# Patient Record
Sex: Female | Born: 1955 | Race: White | Hispanic: No | Marital: Single | Smoking: Never smoker
Health system: Southern US, Community
[De-identification: ages and names within clinical notes are randomized; demographics above are authoritative.]

## PROBLEM LIST (undated history)

## (undated) DIAGNOSIS — E785 Hyperlipidemia, unspecified: Secondary | ICD-10-CM

## (undated) DIAGNOSIS — R112 Nausea with vomiting, unspecified: Secondary | ICD-10-CM

## (undated) DIAGNOSIS — D649 Anemia, unspecified: Secondary | ICD-10-CM

## (undated) DIAGNOSIS — F32A Depression, unspecified: Secondary | ICD-10-CM

## (undated) DIAGNOSIS — E039 Hypothyroidism, unspecified: Secondary | ICD-10-CM

## (undated) DIAGNOSIS — Z789 Other specified health status: Secondary | ICD-10-CM

## (undated) DIAGNOSIS — M1990 Unspecified osteoarthritis, unspecified site: Secondary | ICD-10-CM

## (undated) DIAGNOSIS — I10 Essential (primary) hypertension: Secondary | ICD-10-CM

## (undated) DIAGNOSIS — C541 Malignant neoplasm of endometrium: Secondary | ICD-10-CM

## (undated) DIAGNOSIS — T8859XA Other complications of anesthesia, initial encounter: Secondary | ICD-10-CM

## (undated) DIAGNOSIS — Z9889 Other specified postprocedural states: Secondary | ICD-10-CM

## (undated) DIAGNOSIS — F988 Other specified behavioral and emotional disorders with onset usually occurring in childhood and adolescence: Secondary | ICD-10-CM

## (undated) DIAGNOSIS — T4145XA Adverse effect of unspecified anesthetic, initial encounter: Secondary | ICD-10-CM

## (undated) DIAGNOSIS — F329 Major depressive disorder, single episode, unspecified: Secondary | ICD-10-CM

## (undated) HISTORY — DX: Essential (primary) hypertension: I10

## (undated) HISTORY — PX: NASAL SEPTUM SURGERY: SHX37

## (undated) HISTORY — DX: Hyperlipidemia, unspecified: E78.5

## (undated) HISTORY — PX: CHOLECYSTECTOMY: SHX55

## (undated) HISTORY — PX: TUBAL LIGATION: SHX77

## (undated) HISTORY — PX: ABDOMINAL HYSTERECTOMY: SHX81

## (undated) HISTORY — PX: DILATION AND CURETTAGE OF UTERUS: SHX78

## (undated) HISTORY — DX: Other specified health status: Z78.9

## (undated) HISTORY — DX: Malignant neoplasm of endometrium: C54.1

## (undated) HISTORY — DX: Unspecified osteoarthritis, unspecified site: M19.90

---

## 2005-05-13 ENCOUNTER — Emergency Department (HOSPITAL_COMMUNITY): Admission: EM | Admit: 2005-05-13 | Discharge: 2005-05-13 | Payer: Self-pay | Admitting: Family Medicine

## 2005-05-19 ENCOUNTER — Emergency Department (HOSPITAL_COMMUNITY): Admission: EM | Admit: 2005-05-19 | Discharge: 2005-05-19 | Payer: Self-pay | Admitting: Family Medicine

## 2005-06-06 ENCOUNTER — Emergency Department (HOSPITAL_COMMUNITY): Admission: EM | Admit: 2005-06-06 | Discharge: 2005-06-06 | Payer: Self-pay | Admitting: Emergency Medicine

## 2005-09-22 ENCOUNTER — Emergency Department (HOSPITAL_COMMUNITY): Admission: EM | Admit: 2005-09-22 | Discharge: 2005-09-22 | Payer: Self-pay | Admitting: Emergency Medicine

## 2005-09-23 ENCOUNTER — Inpatient Hospital Stay (HOSPITAL_COMMUNITY): Admission: AD | Admit: 2005-09-23 | Discharge: 2005-09-23 | Payer: Self-pay | Admitting: Obstetrics and Gynecology

## 2005-09-24 ENCOUNTER — Ambulatory Visit: Payer: Self-pay | Admitting: Obstetrics and Gynecology

## 2005-09-25 ENCOUNTER — Ambulatory Visit (HOSPITAL_COMMUNITY): Admission: RE | Admit: 2005-09-25 | Discharge: 2005-09-25 | Payer: Self-pay | Admitting: Obstetrics and Gynecology

## 2005-09-25 ENCOUNTER — Ambulatory Visit: Payer: Self-pay | Admitting: Obstetrics and Gynecology

## 2005-09-25 ENCOUNTER — Encounter (INDEPENDENT_AMBULATORY_CARE_PROVIDER_SITE_OTHER): Payer: Self-pay | Admitting: Specialist

## 2005-10-09 ENCOUNTER — Ambulatory Visit: Payer: Self-pay | Admitting: Obstetrics and Gynecology

## 2007-08-06 HISTORY — PX: CHOLECYSTECTOMY, LAPAROSCOPIC: SHX56

## 2008-03-14 ENCOUNTER — Emergency Department (HOSPITAL_COMMUNITY): Admission: EM | Admit: 2008-03-14 | Discharge: 2008-03-14 | Payer: Self-pay | Admitting: Emergency Medicine

## 2008-10-19 ENCOUNTER — Ambulatory Visit: Payer: Self-pay | Admitting: Diagnostic Radiology

## 2008-10-19 ENCOUNTER — Emergency Department (HOSPITAL_BASED_OUTPATIENT_CLINIC_OR_DEPARTMENT_OTHER): Admission: EM | Admit: 2008-10-19 | Discharge: 2008-10-19 | Payer: Self-pay | Admitting: Emergency Medicine

## 2008-11-22 ENCOUNTER — Emergency Department (HOSPITAL_COMMUNITY): Admission: EM | Admit: 2008-11-22 | Discharge: 2008-11-22 | Payer: Self-pay | Admitting: Pediatrics

## 2009-08-23 IMAGING — CR DG HIP COMPLETE 2+V*R*
3 series · 3 of 3 positions shown · non-contrast
Comparison: None available

CLINICAL DATA: Fall.  Back pain.

RIGHT HIP - COMPLETE 2+ VIEW

[t pelvis a.p.]
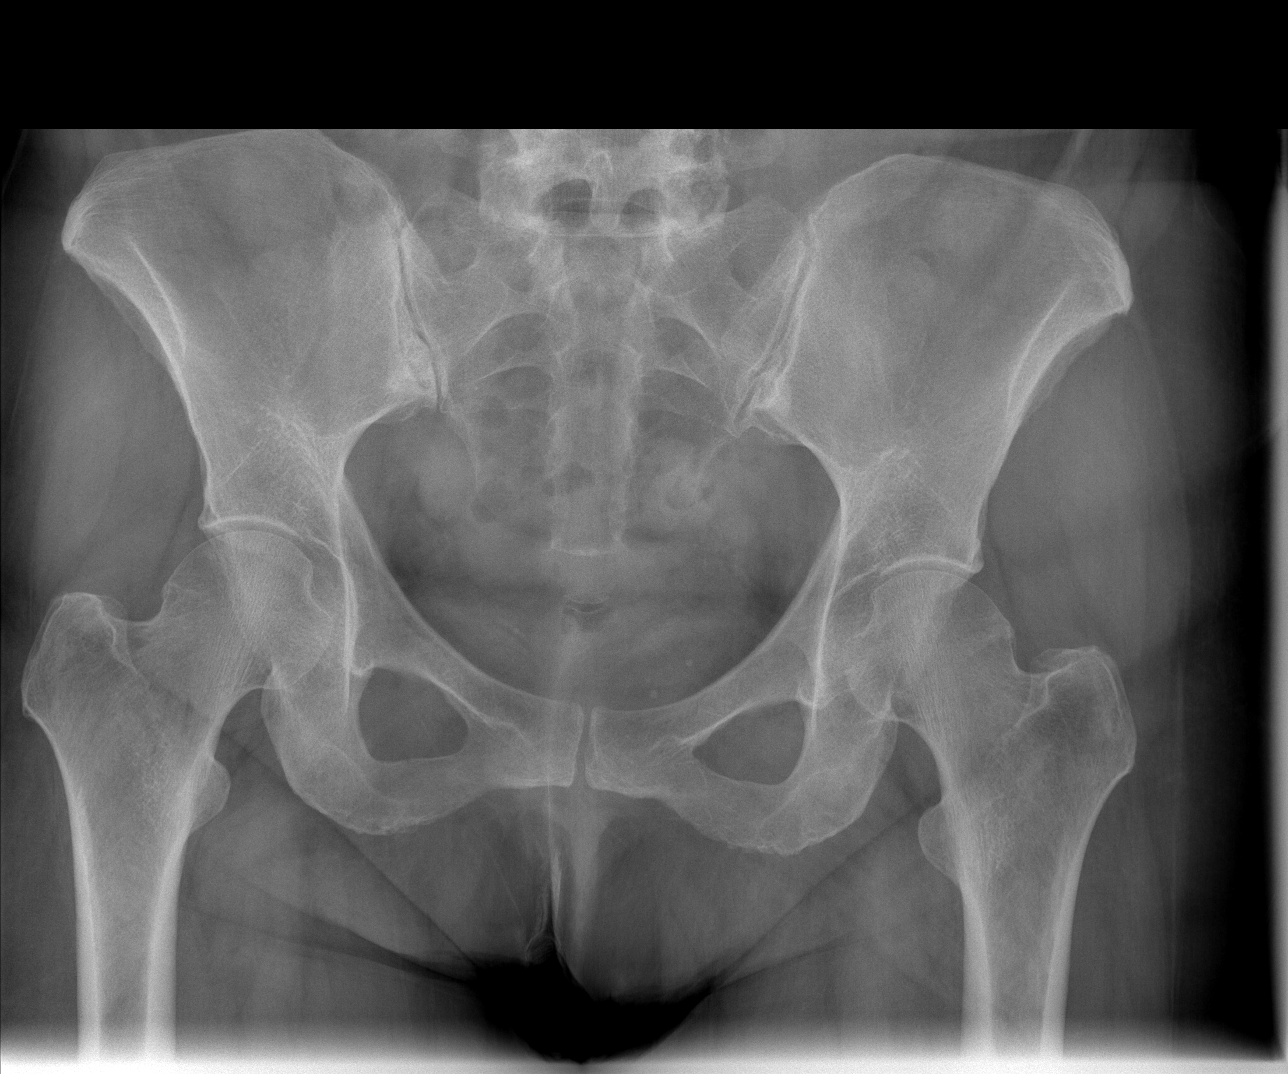

[t hip ap right]
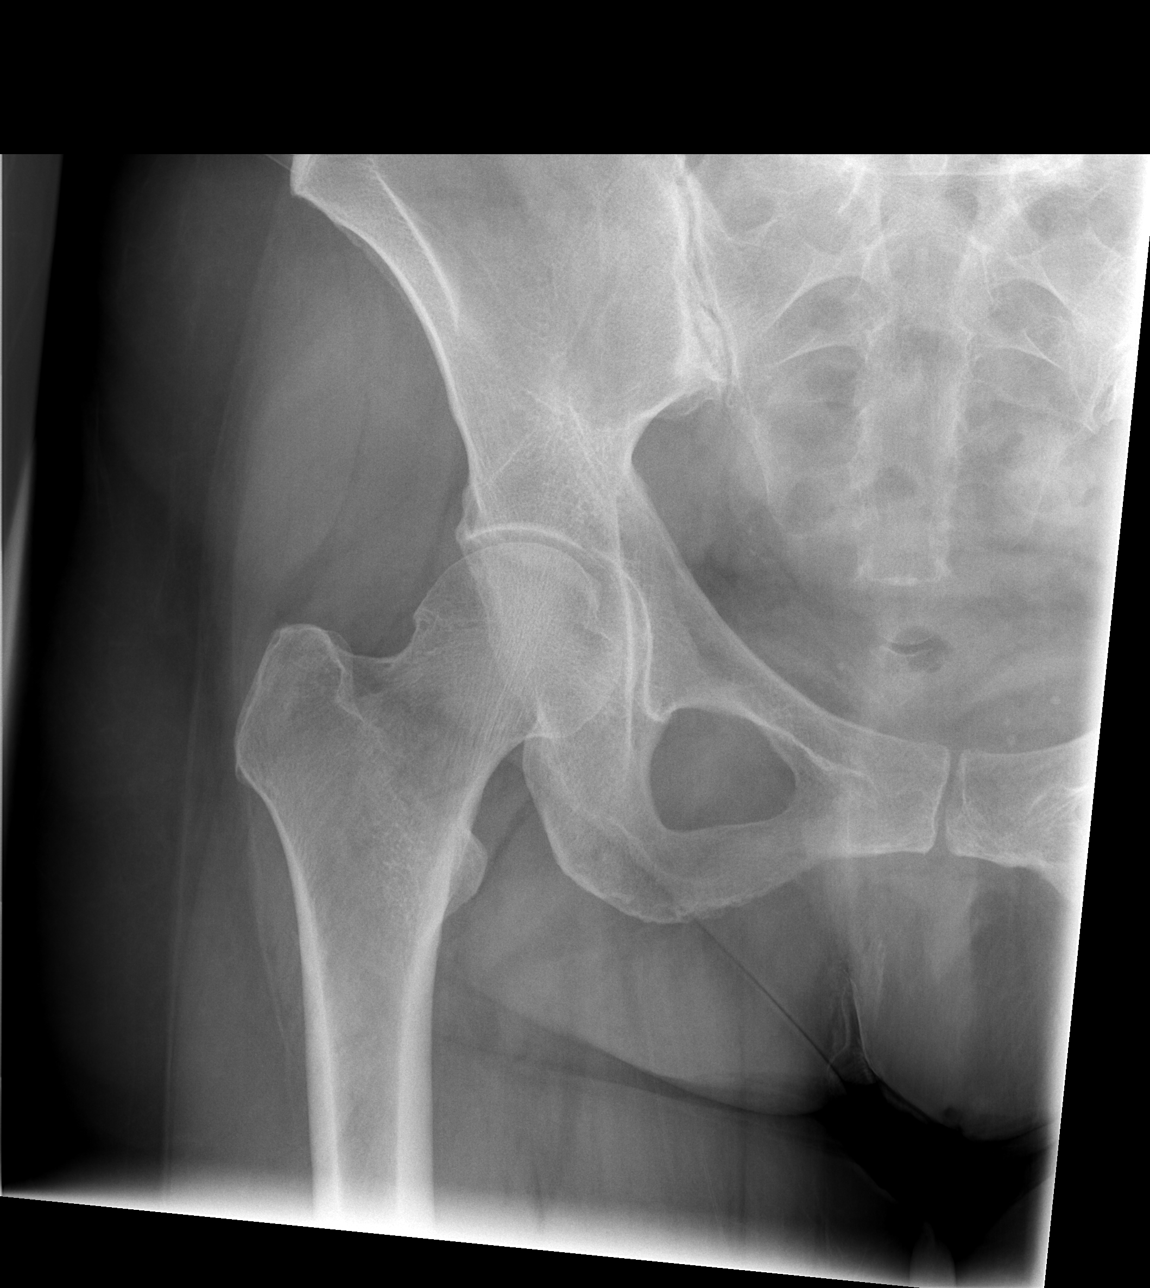

[t hip frog leg right]
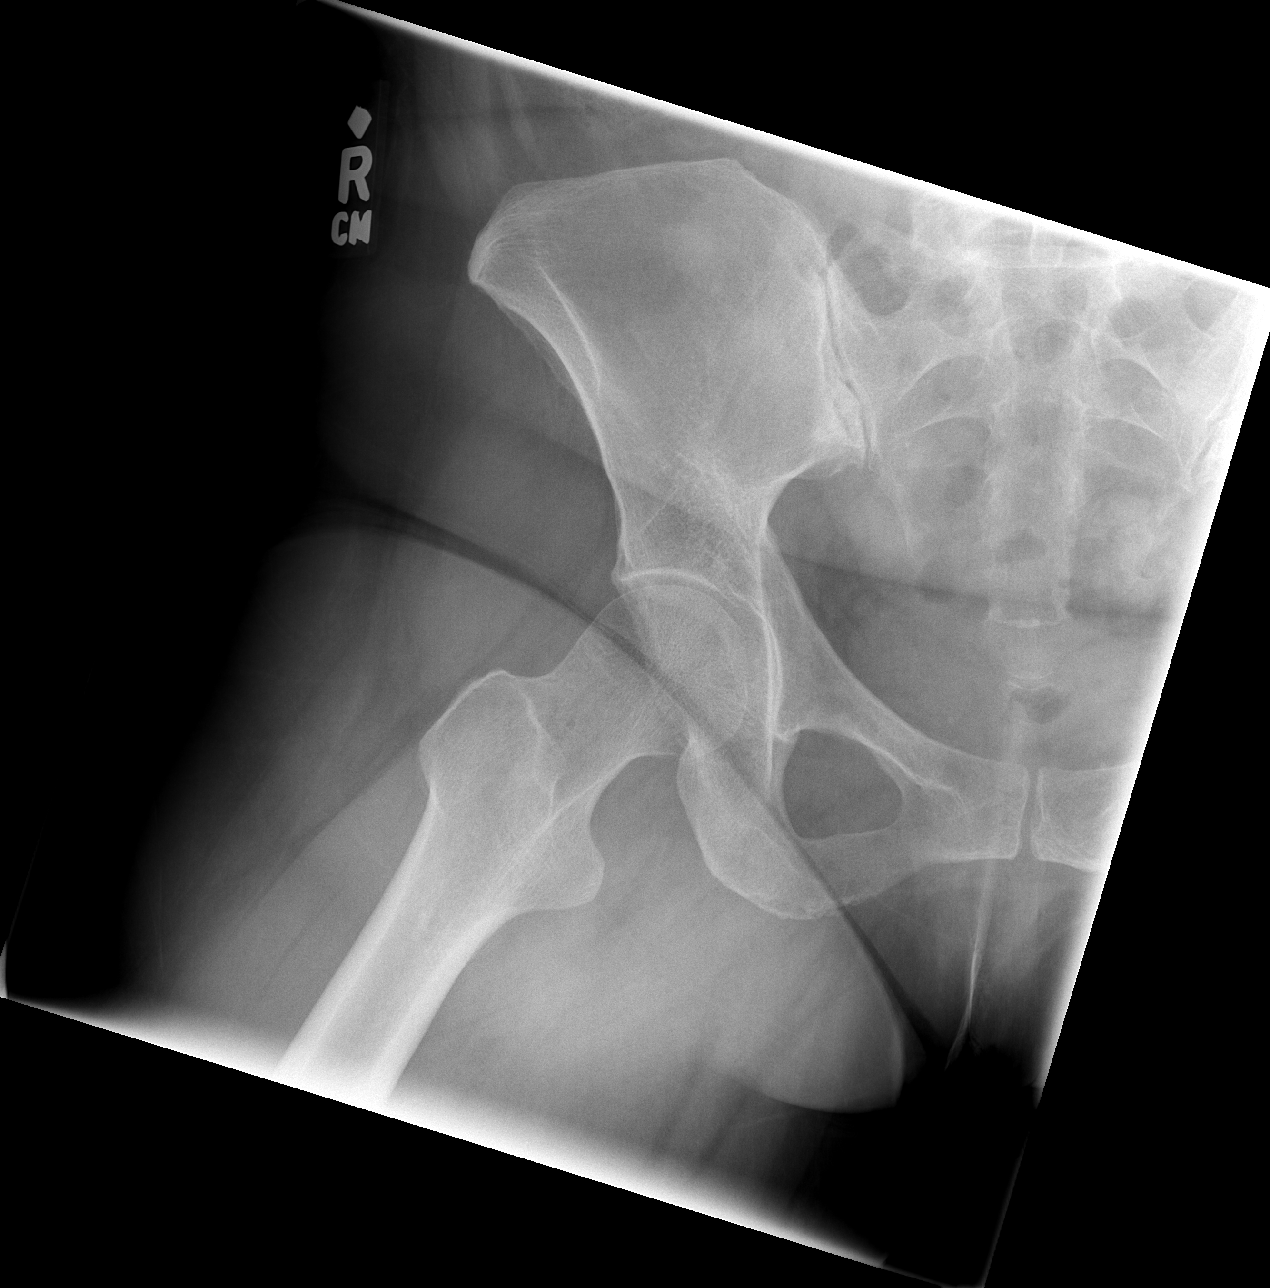

[3 of 3 positions shown; findings below may reference images not displayed]

FINDINGS: Pelvic rings appear intact.  Hip joint spaces are
symmetric bilaterally.  No fracture is identified.  Calcified
phleboliths are present in the anatomic pelvis.
IMPRESSION: No acute osseous abnormality.

## 2010-12-21 NOTE — Group Therapy Note (Signed)
[name], Emily [name]                ACCOUNT NO.:  [id number]   MEDICAL RECORD NO.:  [id number]          PATIENT TYPE:  WOC   LOCATION:  [facility]                   FACILITY:  [facility]   PHYSICIAN:  Argentina Donovan, MD        DATE OF BIRTH:  Jul 09, 1956   DATE OF SERVICE:  09/24/2005                                    CLINIC NOTE   The patient is a 55 year old white female, gravida 1, para 1-0-0-1, who has  severe perimenopausal bleeding, has been bleeding for about 3 weeks and had  a hemoglobin in the emergency room of 11.3 and was told that she probably  had a pelvic mass.   EXAMINATION:  ABDOMEN:  Soft, flat, nontender, no masses and no  organomegaly.  PELVIC EXAMINATION:  External genitalia is normal with the exception of  bleeding from the vault.  BUS within normal limits.  The vagina is clean  with heavy bleeding within the vault and clots within the cervix.  The  cervix is fish-mouthed and hypertrophied, and the uterus palpates about the  normal size and shape.   It was decided we will do a D&C instead of endometrial biopsy because of the  amount of bleeding.  I have discussed hormone therapy with the patient, who  is a non-smoker.  The only medication she is on is Effexor 300 q.h.s. and  Klonopin 1 mg t.i.d.   IMPRESSION:  Perimenopausal bleeding, to rule out endometrial hyperplasia.   PLAN:  D&C.           ______________________________  Argentina Donovan, MD     PR/MEDQ  D:  09/24/2005  T:  09/24/2005  Job:  [id number]

## 2010-12-21 NOTE — Op Note (Signed)
Emily [name], Emily [name]                ACCOUNT NO.:  [id number]   MEDICAL RECORD NO.:  [id number]          PATIENT TYPE:  AMB   LOCATION:  [facility]                           [facility]   PHYSICIAN:  Phil D. Okey Dupre, M.D.     DATE OF BIRTH:  Dec 13, 1955   DATE OF PROCEDURE:  09/25/2005  DATE OF DISCHARGE:                                 OPERATIVE REPORT   PROCEDURE:  Dilatation curettage. Examination under anesthesia.   PREOPERATIVE DIAGNOSIS:  Dysfunctional uterine bleeding, perimenopausal.   POSTOPERATIVE DIAGNOSIS:  Dysfunctional uterine bleeding, perimenopausal.  Pending pathology report.   SURGEON:  Dr. Okey Dupre   SPECIMENS TO PATHOLOGY:  Endometrial curettings.   ANESTHESIA:  MAC plus local.   POSTOPERATIVE CONDITION:  Satisfactory.   REASON FOR PROCEDURE:  The patient has been a 55 year old nonsmoking female  who has been bleeding for more than 3 weeks. Endometrial biopsy in the  office was deferred because of the amount of bleeding and instead she is  being admitted for dilatation curettage and exam under anesthesia.   DESCRIPTION OF PROCEDURE:  Under satisfactory MAC analgesia with the patient  in dorsolithotomy position, the perineum, vagina prepped and draped in usual  sterile manner. Bimanual pelvic examination revealed the uterus of normal  size, shape, consistency with a large fishmouth cervix. A weighted speculum  placed in posterior fourchette of the vagina through a marital introitus.  BUS within normal limits. Vagina is clean and well rugated. The anterior lip  of the fishmouth cervix grasped with a single-tooth tenaculum. Uterine  cavity sounded to 7 cm. The cervical os dilated to #7 Hagar dilator. Uterine  cavity explored with a stone forceps followed by curettage with a small  serrated curette and scant amount of endometrial tissue obtained and sent  for pathological diagnosis. The tenaculum and speculum removed from vagina.  The patient transferred to recovery room in  satisfactory condition. The plan  is hormonal control of this dysfunctional bleeding.           ______________________________  Javier Glazier. Okey Dupre, M.D.     PDR/MEDQ  D:  09/25/2005  T:  09/26/2005  Job:  [id number]
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
## 2010-12-21 NOTE — Group Therapy Note (Signed)
NAMEDEYSY, SCHABEL                ACCOUNT NO.:  [id number]   MEDICAL RECORD NO.:  [id number]          PATIENT TYPE:  WOC   LOCATION:  [facility]                   FACILITY:  [facility]   PHYSICIAN:  Argentina Donovan, MD        DATE OF BIRTH:  08/14/1955   DATE OF SERVICE:                                    CLINIC NOTE   HISTORY OF PRESENT ILLNESS:  The patient is a 55 year old white non-smoking  female who underwent D&C for menometrorrhagia on September 25, 2005, and her  diagnosis returned of benign proliferative endometrium with breakdown  associated with chronic endometritis.  She is still spotting.  We are going  to start her on doxycycline for 10 days and Yaz oral contraceptives for 2  months.  We will see her back at the end of 2 months to evaluate her  condition.           ______________________________  Argentina Donovan, MD     PR/MEDQ  D:  10/09/2005  T:  10/09/2005  Job:  [id number]

## 2011-02-23 ENCOUNTER — Encounter: Payer: Self-pay | Admitting: *Deleted

## 2011-02-23 ENCOUNTER — Emergency Department (HOSPITAL_BASED_OUTPATIENT_CLINIC_OR_DEPARTMENT_OTHER)
Admission: EM | Admit: 2011-02-23 | Discharge: 2011-02-23 | Disposition: A | Discharge status: Home / Self Care | Payer: Self-pay | Attending: Emergency Medicine | Admitting: Emergency Medicine

## 2011-02-23 DIAGNOSIS — J069 Acute upper respiratory infection, unspecified: Secondary | ICD-10-CM | POA: Insufficient documentation

## 2011-02-23 DIAGNOSIS — H6120 Impacted cerumen, unspecified ear: Secondary | ICD-10-CM | POA: Insufficient documentation

## 2011-02-23 DIAGNOSIS — I10 Essential (primary) hypertension: Secondary | ICD-10-CM | POA: Insufficient documentation

## 2011-02-23 HISTORY — DX: Essential (primary) hypertension: I10

## 2011-02-23 MED ORDER — BENZONATATE 100 MG PO CAPS: 200.0000 mg | ORAL_CAPSULE | Freq: Two times a day (BID) | ORAL | Status: AC | PRN

## 2011-02-23 MED ORDER — CETIRIZINE-PSEUDOEPHEDRINE ER 5-120 MG PO TB12: 1.0000 | ORAL_TABLET | Freq: Every day | ORAL | Status: AC

## 2011-02-23 MED ORDER — DOCUSATE SODIUM 100 MG PO CAPS
ORAL_CAPSULE | ORAL | Status: AC
  Administered 2011-02-23: 200 mg
  Filled 2011-02-23: qty 2

## 2011-02-23 NOTE — ED Notes (Signed)
Pt states she has had a cold x 1 week. Ears have been stopped up as well. Works at [employer] and can't hear customers.

## 2011-02-23 NOTE — ED Provider Notes (Signed)
History     Chief Complaint  Patient presents with   Ear Fullness   HPI Comments: Patient has had bilateral ear pain for the last week. States that she has been struggling with a mild cough, sore throat, nasal congestion. The ear fullness has been constant, mild, gradually getting worse, and associated with decreased hearing bilaterally. She has never had these symptoms before but has been using peroxide in her ears and Q-tips to try to clear them out. She denies fevers, nausea, vomiting, abdominal or chest pain.  Patient is a 54 y.o. female presenting with plugged ear sensation. The history is provided by the patient.  Ear Fullness    Past Medical History  Diagnosis Date   Hypertension     Past Surgical History  Procedure Date   Cholecystectomy     History reviewed. No pertinent family history.  History  Substance Use Topics   Smoking status: Never Smoker    Smokeless tobacco: Not on file   Alcohol Use: No    OB History    Grav Para Term Preterm Abortions TAB SAB Ect Mult Living                  Review of Systems  Constitutional: Negative for fever and chills.  HENT: Positive for ear pain, congestion and sore throat.   Eyes: Negative for discharge and redness.  Respiratory: Positive for cough.     Physical Exam  BP 158/100   Pulse 92   Temp(Src) 98.9 F (37.2 C) (Oral)   Resp 20   Ht 5\' 5"  (1.651 m)   Wt 186 lb (84.369 kg)   BMI 30.95 kg/m2   SpO2 98%  Physical Exam  Constitutional: She appears well-developed and well-nourished. No distress.  HENT:  Head: Normocephalic and atraumatic.       Bilateral cerumen impactions. No tenderness to palpation of the tragus or manipulation of the auricle. Membranes not visualized.mucous membranes are moist. Posterior pharynx has mild erythema. No exudate, asymmetry, uvular deviation.  Eyes: Conjunctivae are normal. Right eye exhibits no discharge. Left eye exhibits no discharge. No scleral icterus.  Neck: Normal range  of motion.  Cardiovascular: Normal rate, regular rhythm and normal heart sounds.   Pulmonary/Chest: Effort normal and breath sounds normal. No respiratory distress. She has no wheezes. She has no rales.  Abdominal: Soft. There is no tenderness.  Musculoskeletal: Normal range of motion. She exhibits no edema and no tenderness.  Lymphadenopathy:    She has no cervical adenopathy.  Neurological: She is alert. Coordination normal.  Skin: Skin is warm and dry. No rash noted. She is not diaphoretic.    ED Course  FOREIGN BODY REMOVAL Date/Time: 02/23/2011 10:00 PM Performed by: Eber Hong D Authorized by: Eber Hong D Consent: Verbal consent obtained. Risks and benefits: risks, benefits and alternatives were discussed Consent given by: patient Patient understanding: patient states understanding of the procedure being performed Patient identity confirmed: verbally with patient Body area: ear Location details: left ear Patient sedated: no Localization method: visualized Removal mechanism: curette and irrigation Complexity: simple 1 objects recovered. Objects recovered: Cerumen Post-procedure assessment: residual foreign bodies remain Patient tolerance: Patient tolerated the procedure well with no immediate complications. Comments: Irrigation with Colace and warm saline after removal of the majority of last fall the left. Right cerumen not removed due to pain and  Patient not tolerating procedure on right.    MDM Cerumen removed manually and with irrigation. Will DC home with instructions for proper ear  care including not using Q-tips for further cleaning. We'll also start antihistamine such as Zyrtec for the cough congestion and upper respiratory symptoms. They're clear lungs bilaterally, no fever, normal pulse. I doubt pneumonia.     Vida Roller, MD 02/23/11 2219

## 2011-02-26 ENCOUNTER — Emergency Department (HOSPITAL_BASED_OUTPATIENT_CLINIC_OR_DEPARTMENT_OTHER)
Admission: EM | Admit: 2011-02-26 | Discharge: 2011-02-27 | Disposition: A | Discharge status: Home / Self Care | Payer: Self-pay | Attending: Emergency Medicine | Admitting: Emergency Medicine

## 2011-02-26 ENCOUNTER — Encounter (HOSPITAL_BASED_OUTPATIENT_CLINIC_OR_DEPARTMENT_OTHER): Payer: Self-pay | Admitting: Emergency Medicine

## 2011-02-26 DIAGNOSIS — H60399 Other infective otitis externa, unspecified ear: Secondary | ICD-10-CM | POA: Insufficient documentation

## 2011-02-26 DIAGNOSIS — H9209 Otalgia, unspecified ear: Secondary | ICD-10-CM | POA: Insufficient documentation

## 2011-02-26 NOTE — ED Notes (Signed)
Pt c/o ear fullness with decreased hearing. Pt states she has left ear pain. Pt was seen here 7/21 for same and reports no improvement. Pt has head and sinus congestion

## 2011-02-27 MED ORDER — CIPROFLOXACIN-DEXAMETHASONE 0.3-0.1 % OT SUSP: 3.0000 [drp] | Freq: Two times a day (BID) | OTIC | Status: AC

## 2011-02-27 MED ORDER — CIPROFLOXACIN-DEXAMETHASONE 0.3-0.1 % OT SUSP
4.0000 [drp] | Freq: Once | OTIC | Status: DC
  Filled 2011-02-27: qty 7.5

## 2011-02-27 NOTE — ED Provider Notes (Signed)
History     Chief Complaint  Patient presents with   Ear Fullness   Otalgia   Patient is a 55 y.o. female presenting with plugged ear sensation and ear pain. The history is provided by the patient.  Ear Fullness This is a recurrent problem. The current episode started more than 2 days ago. The problem occurs constantly. The problem has not changed since onset.Pertinent negatives include no chest pain, no abdominal pain, no headaches and no shortness of breath. The symptoms are aggravated by nothing. The symptoms are relieved by nothing.  Otalgia Pertinent negatives include no ear discharge, no headaches, no abdominal pain, no neck pain and no rash.  tretaed here for cerumen impaction both ears and has ongoing fullness L ear and decreased hearing, no F/C, mild ear pain, no HA or rash or neck pain. Some ongoing sore throat and congestion with h/o allergies.   Past Medical History  Diagnosis Date   Hypertension     Past Surgical History  Procedure Date   Cholecystectomy     No family history on file.  History  Substance Use Topics   Smoking status: Never Smoker    Smokeless tobacco: Not on file   Alcohol Use: No    OB History    Grav Para Term Preterm Abortions TAB SAB Ect Mult Living                  Review of Systems  Constitutional: Negative for fever and chills.  HENT: Positive for ear pain and congestion. Negative for nosebleeds, neck pain, neck stiffness, tinnitus and ear discharge.   Eyes: Negative for pain.  Respiratory: Negative for shortness of breath.   Cardiovascular: Negative for chest pain.  Gastrointestinal: Negative for abdominal pain.  Genitourinary: Negative for dysuria.  Musculoskeletal: Negative for back pain.  Skin: Negative for rash.  Neurological: Negative for headaches.  All other systems reviewed and are negative.    Physical Exam  BP 136/79   Pulse 82   Temp(Src) 98.2 F (36.8 C) (Oral)   Resp 17   Ht 5\' 5"  (1.651 m)   Wt 186 lb  (84.369 kg)   BMI 30.95 kg/m2   SpO2 98%  Physical Exam  Constitutional: She is oriented to person, place, and time. She appears well-developed and well-nourished.  HENT:  Head: Normocephalic and atraumatic.  Left Ear: No swelling or tenderness. Tympanic membrane is erythematous. Decreased hearing is noted.       R TM not visulaized 2/2 cerumen. L TM not visulaized 2/2 debris in canal, there is mild abrasion c/w h/o cerumen removal  Eyes: Conjunctivae and EOM are normal. Pupils are equal, round, and reactive to light.  Neck: Trachea normal. Neck supple. No thyromegaly present.  Cardiovascular: Normal rate, regular rhythm, S1 normal, S2 normal and normal pulses.     No systolic murmur is present   No diastolic murmur is present  Pulses:      Radial pulses are 2+ on the right side, and 2+ on the left side.  Pulmonary/Chest: Effort normal and breath sounds normal. She has no wheezes. She has no rhonchi. She has no rales. She exhibits no tenderness.  Abdominal: Soft. Normal appearance and bowel sounds are normal. There is no tenderness. There is no CVA tenderness and negative Murphy's sign.  Musculoskeletal:       BLE:s Calves nontender, no cords or erythema, negative Homans sign  Neurological: She is alert and oriented to person, place, and time. She has normal  strength. No cranial nerve deficit or sensory deficit. GCS eye subscore is 4. GCS verbal subscore is 5. GCS motor subscore is 6.  Skin: Skin is warm and dry. No rash noted. She is not diaphoretic.  Psychiatric: Her speech is normal.       Cooperative and appropriate    ED Course  Procedures  MDM L TM debris and pain will treat for OE and provide referral to ENT.  No symptoms of cerumen impaction R TM. No systemic symptoms of infection, no mastoid tenderness or HAs.       Emily Nielsen, MD 02/27/11 [phone]

## 2012-11-25 ENCOUNTER — Ambulatory Visit: Payer: Self-pay | Admitting: Podiatry

## 2012-11-27 ENCOUNTER — Ambulatory Visit (INDEPENDENT_AMBULATORY_CARE_PROVIDER_SITE_OTHER): Payer: BC Managed Care – PPO | Admitting: Podiatry

## 2012-11-27 ENCOUNTER — Encounter (HOSPITAL_BASED_OUTPATIENT_CLINIC_OR_DEPARTMENT_OTHER): Payer: Self-pay | Admitting: Emergency Medicine

## 2012-11-27 ENCOUNTER — Encounter: Payer: Self-pay | Admitting: Podiatry

## 2012-11-27 VITALS — BP 114/79 | HR 70 | Ht 65.0 in | Wt 198.0 lb

## 2012-11-27 DIAGNOSIS — M7750 Other enthesopathy of unspecified foot: Secondary | ICD-10-CM

## 2012-11-27 DIAGNOSIS — M25579 Pain in unspecified ankle and joints of unspecified foot: Secondary | ICD-10-CM | POA: Insufficient documentation

## 2012-11-27 DIAGNOSIS — M21969 Unspecified acquired deformity of unspecified lower leg: Secondary | ICD-10-CM | POA: Insufficient documentation

## 2012-11-27 DIAGNOSIS — M7741 Metatarsalgia, right foot: Secondary | ICD-10-CM

## 2012-11-27 DIAGNOSIS — M7742 Metatarsalgia, left foot: Secondary | ICD-10-CM | POA: Insufficient documentation

## 2012-11-27 NOTE — Progress Notes (Signed)
°  SUBJECTIVE: 57 y.o. year old female presents complaining of foot pain. Hurts all over for the past 6 months.  Patient is on her feet 10 hours, 6 days a week.  REVIEW OF SYSTEMS: Constitutional: negative for anorexia, chills, fatigue, fevers, malaise, night sweats, sweats and weight loss Eyes: negative Ears, nose, mouth, throat, and face: negative Respiratory: negative Cardiovascular: Has hypertension. Gastrointestinal: negative Integument/breast: negative Musculoskeletal:None except foot pain. Neurological: negative Endocrine: negative Allergic/Immunologic: negative  OBJECTIVE: DERMATOLOGIC EXAMINATION: Skin and Nails: Normal findings.  VASCULAR EXAMINATION OF LOWER LIMBS: Pedal pulses: All pedal pulses are palpable with normal pulsation.  Capillary Filling times within 3 seconds in all digits.  No visible edema or erythema noted. No temperature change noted.   NEUROLOGIC EXAMINATION OF THE LOWER LIMBS: All epicritic and tactile sensations grossly intact.   MUSCULOSKELETAL EXAMINATION: Positive for tight Achilles tendon right with knee flexed or extended, normal on left. Positive for excess motion at the first Metatarsocuneiform joint bilateral.  RADIOGRAPHIC STUDIES:  Cavus type foot with mild elevation of the first ray bilateral. Positive of plantar calcaneal spur bilateral.  ASSESSMENT: 1. Metatarsalgia. 2. Ankle equinus right. 3. Unstable first ray bilateral.  PLAN: 1. Reviewed all findings.  2. Recommended to stretch Achilles tendon, use Metatarsal binder, reduce weight bearing activity, and use Orthotic shoe inserts. 3. Patient picked up Metatarsal binder today. Will return for Casting on Orthotic preparation.

## 2012-12-07 ENCOUNTER — Ambulatory Visit: Payer: Self-pay | Admitting: Podiatry

## 2012-12-21 ENCOUNTER — Ambulatory Visit: Payer: Self-pay | Admitting: Podiatry

## 2013-12-19 ENCOUNTER — Emergency Department (HOSPITAL_BASED_OUTPATIENT_CLINIC_OR_DEPARTMENT_OTHER)
Admission: EM | Admit: 2013-12-19 | Discharge: 2013-12-19 | Disposition: A | Discharge status: Home / Self Care | Payer: Self-pay | Attending: Emergency Medicine | Admitting: Emergency Medicine

## 2013-12-19 ENCOUNTER — Encounter (HOSPITAL_BASED_OUTPATIENT_CLINIC_OR_DEPARTMENT_OTHER): Payer: Self-pay | Admitting: Emergency Medicine

## 2013-12-19 ENCOUNTER — Emergency Department (HOSPITAL_BASED_OUTPATIENT_CLINIC_OR_DEPARTMENT_OTHER): Payer: Self-pay

## 2013-12-19 DIAGNOSIS — R911 Solitary pulmonary nodule: Secondary | ICD-10-CM | POA: Insufficient documentation

## 2013-12-19 DIAGNOSIS — Z79899 Other long term (current) drug therapy: Secondary | ICD-10-CM | POA: Insufficient documentation

## 2013-12-19 DIAGNOSIS — I10 Essential (primary) hypertension: Secondary | ICD-10-CM | POA: Insufficient documentation

## 2013-12-19 DIAGNOSIS — R55 Syncope and collapse: Secondary | ICD-10-CM

## 2013-12-19 DIAGNOSIS — R42 Dizziness and giddiness: Secondary | ICD-10-CM | POA: Insufficient documentation

## 2013-12-19 DIAGNOSIS — R112 Nausea with vomiting, unspecified: Secondary | ICD-10-CM | POA: Insufficient documentation

## 2013-12-19 DIAGNOSIS — R079 Chest pain, unspecified: Secondary | ICD-10-CM | POA: Insufficient documentation

## 2013-12-19 LAB — CBC WITH DIFFERENTIAL/PLATELET
Basophils Absolute: 0.1 K/uL (ref 0.0–0.1)
Basophils Relative: 1 % (ref 0–1)
Eosinophils Absolute: 0.1 K/uL (ref 0.0–0.7)
Eosinophils Relative: 1 % (ref 0–5)
HCT: 38.9 % (ref 36.0–46.0)
Hemoglobin: 13.9 g/dL (ref 12.0–15.0)
Lymphocytes Relative: 30 % (ref 12–46)
Lymphs Abs: 3 K/uL (ref 0.7–4.0)
MCH: 29.2 pg (ref 26.0–34.0)
MCHC: 35.7 g/dL (ref 30.0–36.0)
MCV: 81.7 fL (ref 78.0–100.0)
Monocytes Absolute: 0.5 K/uL (ref 0.1–1.0)
Monocytes Relative: 5 % (ref 3–12)
Neutro Abs: 6.4 K/uL (ref 1.7–7.7)
Neutrophils Relative %: 64 % (ref 43–77)
Platelets: 236 K/uL (ref 150–400)
RBC: 4.76 MIL/uL (ref 3.87–5.11)
RDW: 13.7 % (ref 11.5–15.5)
WBC: 10 K/uL (ref 4.0–10.5)

## 2013-12-19 LAB — BASIC METABOLIC PANEL
BUN: 11 mg/dL (ref 6–23)
CALCIUM: 9.3 mg/dL (ref 8.4–10.5)
CHLORIDE: 104 meq/L (ref 96–112)
CO2: 24 meq/L (ref 19–32)
CREATININE: 0.9 mg/dL (ref 0.50–1.10)
GFR calc Af Amer: 81 mL/min — ABNORMAL LOW (ref >90)
GFR calc non Af Amer: 70 mL/min — ABNORMAL LOW (ref >90)
GLUCOSE: 96 mg/dL (ref 70–99)
Potassium: 3 mEq/L — ABNORMAL LOW (ref 3.7–5.3)
Sodium: 144 mEq/L (ref 137–147)

## 2013-12-19 LAB — CBG MONITORING, ED: Glucose-Capillary: 78 mg/dL (ref 70–99)

## 2013-12-19 LAB — TROPONIN I: Troponin I: <0.3 ng/mL

## 2013-12-19 LAB — D-DIMER, QUANTITATIVE: D-Dimer, Quant: 0.33 ug{FEU}/mL (ref 0.00–0.48)

## 2013-12-19 MED ORDER — POTASSIUM CHLORIDE CRYS ER 20 MEQ PO TBCR
40.0000 meq | EXTENDED_RELEASE_TABLET | Freq: Once | ORAL | Status: AC
  Administered 2013-12-19: 40 meq via ORAL
  Filled 2013-12-19: qty 2

## 2013-12-19 NOTE — ED Notes (Signed)
Pt reports she was in [place] on Wednesday- states she's been feeling very dizzy "like I'm going to pass out but i dont"- flew to [facility] on Friday- Sx not improving

## 2013-12-19 NOTE — ED Provider Notes (Signed)
CSN: [id number]     Arrival date & time 12/19/13  1818 History  This chart was scribed for Emily Relic, MD by Marcha Dutton, ED Scribe. This patient was seen in room MH10/MH10 and the patient's care was started at 6:50 PM.    Chief Complaint  Patient presents with   Dizziness    The history is provided by the patient. No language interpreter was used.    HPI Comments: [name] is a 58 y.o. female with a h/o HTN who presents to the Emergency Department complaining of several daily episodes of light headedness lasting a few minutes that began 5 days ago. Pt reports associated nausea and vomiting that began yesterday. She states the episodes occur when she gets up to walk. She states sitting down helps to alleviate her feeling. Pt reports slight blurriness in both eyes during episodes.   Pt states she had two episodes of what she calls indigestion after eating one two days ago and one yesterday lasting 30 minutes each. She states it felt like chest pressure and denies these episodes being related to her spells of lightheadedness. Pt denies dizziness or sensations of motion/spinning, HAs, changes in speech, swallowing, or walking, blindness, CP, SOB, weakness or numbness to one side of the body, cough, fever, abdominal pain, and bloody stools.  Pt denies h/o DM, CVAs, and cancer, and states she is otherwise healthy. Pt reports she flies to [facility] frequently until July.   Past Medical History  Diagnosis Date   Hypertension     Past Surgical History  Procedure Laterality Date   Cholecystectomy      No family history on file. History  Substance Use Topics   Smoking status: Never Smoker    Smokeless tobacco: Never Used   Alcohol Use: No    OB History   Grav Para Term Preterm Abortions TAB SAB Ect Mult Living                  Review of Systems 10 Systems reviewed and all are negative for acute change except as noted in the HPI.   Allergies  Review of patient's  allergies indicates no known allergies.   Home Medications   Prior to Admission medications   Medication Sig Start Date End Date Taking? Authorizing Provider  buPROPion (WELLBUTRIN XL) 300 MG 24 hr tablet Take 300 mg by mouth daily.   Yes Historical Provider, MD  cloNIDine (CATAPRES) 0.3 MG tablet Take 0.3 mg by mouth daily.   Yes Historical Provider, MD  oxymetazoline (AFRIN) 0.05 % nasal spray Place 2 sprays into the nose 2 (two) times daily.     Yes Historical Provider, MD  zolpidem (AMBIEN) 5 MG tablet Take 5 mg by mouth at bedtime as needed for sleep.   Yes Historical Provider, MD  cloNIDine (CATAPRES) 0.2 MG tablet Take 0.2 mg by mouth 2 (two) times daily.      Historical Provider, MD    Triage Vitals: BP 157/87   Pulse 90   Temp(Src) 99.1 F (37.3 C) (Oral)   Resp 24   Ht 5\' 5"  (1.651 m)   Wt 200 lb (90.719 kg)   BMI 33.28 kg/m2   SpO2 96%   Physical Exam  Nursing note and vitals reviewed. Constitutional: She is oriented to person, place, and time. She appears well-developed and well-nourished. No distress.  Awake, alert, nontoxic appearance with baseline speech for patient.  HENT:  Head: Normocephalic and atraumatic.  Mouth/Throat: No oropharyngeal exudate.  Eyes: EOM are normal. Pupils are equal, round, and reactive to light. Right eye exhibits no discharge. Left eye exhibits no discharge. Right eye exhibits no nystagmus. Left eye exhibits no nystagmus.  negative test of skew.  Neck: Neck supple.  Cardiovascular: Normal rate, regular rhythm and normal heart sounds.  Exam reveals no gallop and no friction rub.   No murmur heard. Pulmonary/Chest: Effort normal and breath sounds normal. No stridor. No respiratory distress. She has no wheezes. She has no rales. She exhibits no tenderness.  Abdominal: Soft. Bowel sounds are normal. She exhibits no mass. There is no tenderness. There is no rebound.  Musculoskeletal: She exhibits no edema and no tenderness.  Baseline ROM, moves  extremities with no obvious new focal weakness.  Lymphadenopathy:    She has no cervical adenopathy.  Neurological: She is alert and oriented to person, place, and time.  Awake, alert, cooperative and aware of situation; motor strength bilaterally; sensation normal to light touch bilaterally; peripheral visual fields full to confrontation; no facial asymmetry; tongue midline; major cranial nerves appear intact; no pronator drift, normal finger to nose bilaterally, baseline gait without new ataxia.   Skin: Skin is warm and dry. No rash noted. She is not diaphoretic.  Psychiatric: She has a normal mood and affect. Her behavior is normal.    ED Course  Procedures (including critical care time)   DIAGNOSTIC STUDIES: Oxygen Saturation is 96% on RA, adequate by my interpretation.     COORDINATION OF CARE: 7:03 PM- Patient understands and agrees with initial ED impression and plan with expectations set for ED visit.  8:17 PM- Pt informed of possible lung nodile on CXR. Pt does have PCP who can order an outpatient CT and f/u. trmt  Patient / Family / Caregiver informed of clinical course, understand medical decision-making process, and agree with plan. Labs Review Labs Reviewed  BASIC METABOLIC PANEL - Abnormal; Notable for the following:    Potassium 3.0 (*)    GFR calc non Af Amer 70 (*)    GFR calc Af Amer 81 (*)    All other components within normal limits  CBC WITH DIFFERENTIAL  TROPONIN I  D-DIMER, QUANTITATIVE  CBG MONITORING, ED    Imaging Review Dg Chest 2 View  12/19/2013   CLINICAL DATA:  DIZZINESS cp  EXAM: CHEST  2 VIEW  COMPARISON:  None.  FINDINGS: Cardiac silhouette within normal limits. On the frontal view a 1.6 cm nodular density is appreciated within the left lung base. Minimal discoid atelectasis versus scarring left lung base. Lungs otherwise clear. No acute osseus abnormalities.  IMPRESSION: Findings concerning for pulmonary nodule left lung base. Further  evaluation with contrasted chest CT recommended.  Minimal scarring versus atelectasis left lung base.   Electronically Signed   By: Margaree Mackintosh M.D.   On: 12/19/2013 20:03      EKG Interpretation   Date/Time:  Sunday Dec 19 2013 18:38:14 EDT Ventricular Rate:  83 PR Interval:  174 QRS Duration: 104 QT Interval:  390 QTC Calculation: 458 R Axis:   45 Text Interpretation:  Normal sinus rhythm Cannot rule out Anterior infarct  , age undetermined No previous ECGs available Confirmed by [facility]  MD,  Jenny Reichmann ([id number]) on 12/19/2013 7:06:02 PM     MDM  The patient appears reasonably screened and/or stabilized for discharge and I doubt any other medical condition or other [facility] requiring further screening, evaluation, or treatment in the ED at this time prior to discharge.  Final  diagnoses:  Near syncope  Chest pain  Lung nodule seen on imaging study    I doubt any other EMC precluding discharge at this time including, but not necessarily limited to the following:AMI.  I personally performed the services described in this documentation, which was scribed in my presence. The recorded information has been reviewed and is accurate.    Emily Relic, MD 12/21/13 [phone]

## 2013-12-19 NOTE — ED Notes (Signed)
[id number]

## 2013-12-19 NOTE — ED Notes (Signed)
Assumed care of patient from [place], [place].

## 2013-12-19 NOTE — Discharge Instructions (Signed)
Your caregiver has seen you today because you are having problems with feelings of weakness, dizziness, and/or fatigue. Weakness has many different causes, some of which are common and others are very rare. Your caregiver has considered some of the most common causes of weakness and feels it is safe for you to go home and be observed. Not every illness or injury can be identified during an emergency department visit, thus follow-up with your primary healthcare provider is important. Medical conditions can also worsen, so it is also important to return immediately as directed below, or if you have other serious concerns develop. RETURN IMMEDIATELY IF you develop new shortness of breath, chest pain, fever, have difficulty moving parts of your body (new weakness, numbness, or incoordination), sudden change in speech, vision, swallowing, or understanding, faint or develop new dizziness, severe headache, become poorly responsive or have an altered mental status compared to baseline for you, new rash, abdominal pain, or bloody stools,  Return sooner also if you develop new problems for which you have not talked to your caregiver but you feel may be emergency medical conditions, or are unable to be cared for safely at home.  Your caregiver has diagnosed you as having chest pain that is not specific for one problem, but does not require admission.  You are at low risk for an acute heart condition or other serious illness. Chest pain comes from many different causes.  SEEK IMMEDIATE MEDICAL ATTENTION IF: You have severe chest pain, especially if the pain is crushing or pressure-like and spreads to the arms, back, neck, or jaw, or if you have sweating, nausea (feeling sick to your stomach), or shortness of breath. THIS IS AN EMERGENCY. Don't wait to see if the pain will go away. Get medical help at once. Call 911 or 0 (operator). DO NOT drive yourself to the hospital.  Your chest pain gets worse and does not go away  with rest.  You have an attack of chest pain lasting longer than usual, despite rest and treatment with the medications your caregiver has prescribed.  You wake from sleep with chest pain or shortness of breath.  You feel dizzy or faint.  You have chest pain not typical of your usual pain for which you originally saw your caregiver.

## 2017-05-13 ENCOUNTER — Ambulatory Visit (INDEPENDENT_AMBULATORY_CARE_PROVIDER_SITE_OTHER): Payer: BLUE CROSS/BLUE SHIELD

## 2017-05-13 ENCOUNTER — Ambulatory Visit (INDEPENDENT_AMBULATORY_CARE_PROVIDER_SITE_OTHER): Payer: BLUE CROSS/BLUE SHIELD | Admitting: Orthopaedic Surgery

## 2017-05-13 ENCOUNTER — Encounter (INDEPENDENT_AMBULATORY_CARE_PROVIDER_SITE_OTHER): Payer: Self-pay | Admitting: Orthopaedic Surgery

## 2017-05-13 DIAGNOSIS — M1712 Unilateral primary osteoarthritis, left knee: Secondary | ICD-10-CM

## 2017-05-13 MED ORDER — BUPIVACAINE HCL 0.5 % IJ SOLN
2.0000 mL | INTRAMUSCULAR | Status: AC | PRN
  Administered 2017-05-13: 2 mL via INTRA_ARTICULAR

## 2017-05-13 MED ORDER — LIDOCAINE HCL 1 % IJ SOLN
2.0000 mL | INTRAMUSCULAR | Status: AC | PRN
  Administered 2017-05-13: 2 mL

## 2017-05-13 MED ORDER — METHYLPREDNISOLONE ACETATE 40 MG/ML IJ SUSP
40.0000 mg | INTRAMUSCULAR | Status: AC | PRN
  Administered 2017-05-13: 40 mg via INTRA_ARTICULAR

## 2017-05-13 NOTE — Progress Notes (Addendum)
Office Visit Note   Patient: Emily [name]           Date of Birth: Oct 09, 1955           MRN: [id number] Visit Date: 05/13/2017              Requested by: No referring provider defined for this encounter. PCP: System, Pcp Not In   Assessment & Plan: Visit Diagnoses:  1. Primary osteoarthritis of left knee     Plan: Patient has failed extensive conservative treatment at this point. She would like to try another cortisone injection since she has had partial relief from this in the past. She will let us know when she wants to undergo a total knee replacement which is what I have recommended. I provided her with general information on the surgery. We discussed the risks including infection, DVT, failure to achieve the desired results, stiffness, incomplete relief of pain. Patient understands and will be in touch.  Follow-Up Instructions: Return if symptoms worsen or fail to improve.   Orders:  Orders Placed This Encounter  Procedures   Large Joint Injection/Arthrocentesis   XR KNEE 3 VIEW LEFT   Meds ordered this encounter  Medications   bupivacaine (MARCAINE) 0.5 % (with pres) injection 2 mL   lidocaine (XYLOCAINE) 1 % (with pres) injection 2 mL   methylPREDNISolone acetate (DEPO-MEDROL) injection 40 mg      Procedures: Large Joint Inj Date/Time: 05/13/2017 4:47 PM Performed by: Leandrew Koyanagi Authorized by: Leandrew Koyanagi   Consent Given by:  Patient Timeout: prior to procedure the correct patient, procedure, and site was verified   Indications:  Pain Location:  Knee Site:  L knee Prep: patient was prepped and draped in usual sterile fashion   Needle Size:  22 G Ultrasound Guidance: No   Fluoroscopic Guidance: No   Arthrogram: No   Medications:  2 mL lidocaine 1 %; 2 mL bupivacaine 0.5 %; 40 mg methylPREDNISolone acetate 40 MG/ML Patient tolerance:  Patient tolerated the procedure well with no immediate complications     Clinical Data: No additional  findings.   Subjective: Chief Complaint  Patient presents with   Left Knee - Pain    Patient is a 61 year old female comes in as a second opinion for left knee degenerative joint disease 2 years. She works as a Scientist, clinical (histocompatibility and immunogenetics) and she has tried cortisone injections as well as a change injections. She continues to have pain with locking and swelling and stiffness and weakness with giving way is worse in the morning and activity. Denies any numbness and tingling.    Review of Systems  Constitutional: Negative.   HENT: Negative.   Eyes: Negative.   Respiratory: Negative.   Cardiovascular: Negative.   Endocrine: Negative.   Musculoskeletal: Negative.   Neurological: Negative.   Hematological: Negative.   Psychiatric/Behavioral: Negative.   All other systems reviewed and are negative.    Objective: Vital Signs: There were no vitals taken for this visit.  Physical Exam  Constitutional: She is oriented to person, place, and time. She appears well-developed and well-nourished.  HENT:  Head: Normocephalic and atraumatic.  Eyes: EOM are normal.  Neck: Neck supple.  Pulmonary/Chest: Effort normal.  Abdominal: Soft.  Neurological: She is alert and oriented to person, place, and time.  Skin: Skin is warm. Capillary refill takes less than 2 seconds.  Psychiatric: She has a normal mood and affect. Her behavior is normal. Judgment and thought content normal.  Nursing note and  vitals reviewed.   Ortho Exam Left knee exam shows no joint effusion. She has a 10 degree flexion contracture. Collaterals and cruciates are stable.  Specialty Comments:  No specialty comments available.  Imaging: Xr Knee 3 View Left  Result Date: 05/13/2017 Advanced degenerative joint disease of the left knee    PMFS History: Patient Active Problem List   Diagnosis Date Noted   Pain in joint, ankle and foot 11/27/2012   Metatarsalgia of both feet 11/27/2012   Deformity of metatarsal  11/27/2012   Past Medical History:  Diagnosis Date   Hypertension     No family history on file.  Past Surgical History:  Procedure Laterality Date   CHOLECYSTECTOMY     Social History   Occupational History   Not on file.   Social History Main Topics   Smoking status: Never Smoker   Smokeless tobacco: Never Used   Alcohol use No   Drug use: No   Sexual activity: Not Currently    Birth control/ protection: None

## 2017-05-15 ENCOUNTER — Ambulatory Visit (INDEPENDENT_AMBULATORY_CARE_PROVIDER_SITE_OTHER): Payer: Self-pay | Admitting: Orthopaedic Surgery

## 2017-06-18 ENCOUNTER — Other Ambulatory Visit (HOSPITAL_COMMUNITY): Payer: Self-pay | Admitting: *Deleted

## 2017-06-18 ENCOUNTER — Ambulatory Visit (HOSPITAL_COMMUNITY)
Admission: RE | Admit: 2017-06-18 | Discharge: 2017-06-18 | Disposition: A | Discharge status: Home / Self Care | Payer: BLUE CROSS/BLUE SHIELD | Source: Ambulatory Visit | Attending: Orthopaedic Surgery | Admitting: Orthopaedic Surgery

## 2017-06-18 ENCOUNTER — Other Ambulatory Visit: Payer: Self-pay

## 2017-06-18 ENCOUNTER — Encounter (HOSPITAL_COMMUNITY)
Admission: RE | Admit: 2017-06-18 | Discharge: 2017-06-18 | Disposition: A | Discharge status: Home / Self Care | Payer: BLUE CROSS/BLUE SHIELD | Source: Ambulatory Visit | Attending: Orthopaedic Surgery | Admitting: Orthopaedic Surgery

## 2017-06-18 ENCOUNTER — Encounter (HOSPITAL_COMMUNITY): Payer: Self-pay

## 2017-06-18 DIAGNOSIS — Z01812 Encounter for preprocedural laboratory examination: Secondary | ICD-10-CM | POA: Insufficient documentation

## 2017-06-18 DIAGNOSIS — Z01818 Encounter for other preprocedural examination: Secondary | ICD-10-CM | POA: Insufficient documentation

## 2017-06-18 DIAGNOSIS — R918 Other nonspecific abnormal finding of lung field: Secondary | ICD-10-CM | POA: Diagnosis not present

## 2017-06-18 DIAGNOSIS — Z0183 Encounter for blood typing: Secondary | ICD-10-CM | POA: Diagnosis not present

## 2017-06-18 HISTORY — DX: Nausea with vomiting, unspecified: Z98.890

## 2017-06-18 HISTORY — DX: Depression, unspecified: F32.A

## 2017-06-18 HISTORY — DX: Other specified behavioral and emotional disorders with onset usually occurring in childhood and adolescence: F98.8

## 2017-06-18 HISTORY — DX: Anemia, unspecified: D64.9

## 2017-06-18 HISTORY — DX: Other specified postprocedural states: R11.2

## 2017-06-18 HISTORY — DX: Unspecified osteoarthritis, unspecified site: M19.90

## 2017-06-18 HISTORY — DX: Other complications of anesthesia, initial encounter: T88.59XA

## 2017-06-18 HISTORY — DX: Hypothyroidism, unspecified: E03.9

## 2017-06-18 HISTORY — DX: Major depressive disorder, single episode, unspecified: F32.9

## 2017-06-18 HISTORY — DX: Adverse effect of unspecified anesthetic, initial encounter: T41.45XA

## 2017-06-18 HISTORY — DX: Other specified postprocedural states: Z98.890

## 2017-06-18 HISTORY — DX: Nausea with vomiting, unspecified: R11.2

## 2017-06-18 LAB — TYPE AND SCREEN
ABO/RH(D): O POS
Antibody Screen: NEGATIVE

## 2017-06-18 LAB — CBC WITH DIFFERENTIAL/PLATELET
BASOS ABS: 0 10*3/uL (ref 0.0–0.1)
BASOS PCT: 0 %
EOS PCT: 1 %
Eosinophils Absolute: 0.1 10*3/uL (ref 0.0–0.7)
HEMATOCRIT: 38.4 % (ref 36.0–46.0)
Hemoglobin: 13.3 g/dL (ref 12.0–15.0)
Lymphocytes Relative: 24 %
Lymphs Abs: 2.5 10*3/uL (ref 0.7–4.0)
MCH: 28.9 pg (ref 26.0–34.0)
MCHC: 34.6 g/dL (ref 30.0–36.0)
MCV: 83.5 fL (ref 78.0–100.0)
MONO ABS: 0.5 10*3/uL (ref 0.1–1.0)
Monocytes Relative: 4 %
NEUTROS ABS: 7.5 10*3/uL (ref 1.7–7.7)
Neutrophils Relative %: 71 %
PLATELETS: 209 10*3/uL (ref 150–400)
RBC: 4.6 MIL/uL (ref 3.87–5.11)
RDW: 14 % (ref 11.5–15.5)
WBC: 10.6 10*3/uL — AB (ref 4.0–10.5)

## 2017-06-18 LAB — BASIC METABOLIC PANEL
ANION GAP: 11 (ref 5–15)
BUN: 23 mg/dL — ABNORMAL HIGH (ref 6–20)
CALCIUM: 9.2 mg/dL (ref 8.9–10.3)
CO2: 26 mmol/L (ref 22–32)
Chloride: 101 mmol/L (ref 101–111)
Creatinine, Ser: 1.09 mg/dL — ABNORMAL HIGH (ref 0.44–1.00)
GFR calc Af Amer: >60 mL/min (ref >60)
GFR, EST NON AFRICAN AMERICAN: 54 mL/min — AB (ref >60)
GLUCOSE: 93 mg/dL (ref 65–99)
Potassium: 3.1 mmol/L — ABNORMAL LOW (ref 3.5–5.1)
SODIUM: 138 mmol/L (ref 135–145)

## 2017-06-18 LAB — URINALYSIS, ROUTINE W REFLEX MICROSCOPIC
Bilirubin Urine: NEGATIVE
Glucose, UA: NEGATIVE mg/dL
HGB URINE DIPSTICK: NEGATIVE
Ketones, ur: NEGATIVE mg/dL
NITRITE: NEGATIVE
PH: 5 (ref 5.0–8.0)
Protein, ur: NEGATIVE mg/dL
SPECIFIC GRAVITY, URINE: 1.021 (ref 1.005–1.030)

## 2017-06-18 LAB — PROTIME-INR
INR: 0.88
Prothrombin Time: 11.9 seconds (ref 11.4–15.2)

## 2017-06-18 LAB — SURGICAL PCR SCREEN
MRSA, PCR: NEGATIVE
STAPHYLOCOCCUS AUREUS: POSITIVE — AB

## 2017-06-18 LAB — APTT: APTT: 33 s (ref 24–36)

## 2017-06-18 LAB — ABO/RH: ABO/RH(D): O POS

## 2017-06-18 NOTE — Progress Notes (Signed)
Called abnormal UA report to Horatio Pel at Dr. Jerald Kief office.   Mupirocin Rx called into [facility] for positive PCR of Staph. Left message on pt's voicemail informing her of results and need to pick up Rx today and start it tonight.

## 2017-06-18 NOTE — Pre-Procedure Instructions (Addendum)
Emily [name]  06/18/2017    Your procedure is scheduled on Tuesday, June 24, 2017 at 12:40 PM.   Report to [facility] Entrance "A" Admitting Office at 10:40 AM.   Call this number if you have problems the morning of surgery: [phone]   Questions prior to day of surgery, please call [phone] between 8 & 4 PM.   Remember:  Do not eat food or drink liquids after midnight Monday, 06/23/17.  Take these medicines the morning of surgery with A SIP OF WATER: Bupropion (Wellbutrin), Levothyroxine (Synthroid), Sertraline (Zoloft), Tramadol - if needed, Afrin Nasal spray if needed  Do not use Aspirin products or NSAIDS (Diclofenac, Ibuprofen, Aleve, etc) prior to surgery.   Do not wear jewelry, make-up or nail polish.  Do not wear lotions, powders, perfumes or deodorant.  Do not shave 48 hours prior to surgery.    Do not bring valuables to the hospital.  [facility] is not responsible for any belongings or valuables.  Contacts, dentures or bridgework may not be worn into surgery.  Leave your suitcase in the car.  After surgery it may be brought to your room.  For patients admitted to the hospital, discharge time will be determined by your treatment team.  [facility] - Preparing for Surgery  Before surgery, you can play an important role.  Because skin is not sterile, your skin needs to be as free of germs as possible.  You can reduce the number of germs on you skin by washing with CHG (chlorahexidine gluconate) soap before surgery.  CHG is an antiseptic cleaner which kills germs and bonds with the skin to continue killing germs even after washing.  Please DO NOT use if you have an allergy to CHG or antibacterial soaps.  If your skin becomes reddened/irritated stop using the CHG and inform your nurse when you arrive at Short Stay.  Do not shave (including legs and underarms) for at least 48 hours prior to the first CHG shower.  You may shave your face.  Please  follow these instructions carefully:   1.  Shower with CHG Soap the night before surgery and the                    morning of Surgery.  2.  If you choose to wash your hair, wash your hair first as usual with your       normal shampoo.  3.  After you shampoo, rinse your hair and body thoroughly to remove the shampoo.  4.  Use CHG as you would any other liquid soap.  You can apply chg directly       to the skin and wash gently with scrungie or a clean washcloth.  5.  Apply the CHG Soap to your body ONLY FROM THE NECK DOWN.        Do not use on open wounds or open sores.  Avoid contact with your eyes, ears, mouth and genitals (private parts).  Wash genitals (private parts) with your normal soap.  6.  Wash thoroughly, paying special attention to the area where your surgery        will be performed.  7.  Thoroughly rinse your body with warm water from the neck down.  8.  DO NOT shower/wash with your normal soap after using and rinsing off       the CHG Soap.  9.  Pat yourself dry with a clean towel.  10.  Wear clean pajamas.            11.  Place clean sheets on your bed the night of your first shower and do not        sleep with pets.  Day of Surgery  Do not apply any lotions/deodorants the morning of surgery.  Please wear clean clothes to the hospital.   Please read over the fact sheets that you were given.

## 2017-06-18 NOTE — Progress Notes (Signed)
Pt denies cardiac history, chest pain or sob. Pt states she is not diabetic.

## 2017-06-23 NOTE — H&P (Signed)
TOTAL KNEE ADMISSION H&P  Patient is being admitted for left total knee arthroplasty.  Subjective:  Chief Complaint:left knee pain.  HPI: Emily [name], 61 y.o. female, has a history of pain and functional disability in the left knee due to arthritis and has failed non-surgical conservative treatments for greater than 12 weeks to includeNSAID's and/or analgesics, corticosteriod injections, viscosupplementation injections, flexibility and strengthening excercises, use of assistive devices, weight reduction as appropriate and activity modification.  Onset of symptoms was gradual, starting 5 years ago with gradually worsening course since that time. The patient noted no past surgery on the left knee(s).  Patient currently rates pain in the left knee(s) at 10 out of 10 with activity. Patient has night pain, worsening of pain with activity and weight bearing, pain that interferes with activities of daily living, pain with passive range of motion and crepitus.  Patient has evidence of subchondral cysts, subchondral sclerosis, periarticular osteophytes and joint space narrowing by imaging studies. There is no active infection.  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    Current Outpatient Medications  Medication Sig Dispense Refill Last Dose   ALPRAZolam (XANAX) 1 MG tablet Take 1 mg 3  (three) times daily as needed by mouth for sleep.      amphetamine-dextroamphetamine (ADDERALL) 30 MG tablet Take 30 mg daily by mouth.      atorvastatin (LIPITOR) 40 MG tablet Take 40 mg every evening by mouth.       buPROPion (WELLBUTRIN XL) 300 MG 24 hr tablet Take 300 mg by mouth daily.   Taking   diclofenac sodium (VOLTAREN) 1 % GEL Apply 1 application 3 (three) times daily as needed topically for pain.  0    ibuprofen (ADVIL,MOTRIN) 200 MG tablet Take 400 mg every 6 (six) hours as needed by mouth for headache or mild pain.      levothyroxine (SYNTHROID, LEVOTHROID) 50 MCG tablet Take 50 mcg daily by mouth.      losartan-hydrochlorothiazide (HYZAAR) 100-12.5 MG tablet Take 1 tablet daily by mouth.      oxymetazoline (AFRIN) 0.05 % nasal spray Place 2 sprays 2 (two) times daily as needed into the nose for congestion.    Taking   prazosin (MINIPRESS) 5 MG capsule Take 5 mg at bedtime by mouth.       sertraline (ZOLOFT) 100 MG tablet Take 100 mg daily by mouth.      traMADol (ULTRAM) 50 MG tablet Take 50-100 mg 4 (four) times daily as needed by mouth for moderate pain.       No Known Allergies  Social History   Tobacco Use   Smoking status: Never Smoker   Smokeless tobacco: Never Used  Substance Use Topics   Alcohol use: No    Family History  Problem Relation Age of Onset   Breast cancer Mother    Heart murmur Father      Review of Systems  Musculoskeletal: Positive for joint pain.       Left knee  All other systems reviewed and are negative.   Objective:  Physical Exam  Constitutional: She is oriented to person, place, and time. She appears well-developed and well-nourished.  HENT:  Head: Normocephalic and atraumatic.  Eyes: Pupils are equal, round, and reactive to light.  Neck: Normal range of motion.  Cardiovascular: Normal rate and regular rhythm.  Respiratory: Effort normal.  GI: Soft.  Musculoskeletal:  Left knee motion is about 5-110.  She has  pain at both hyperextension and hyperflexion.  The pain is mostly on the medial joint line.  She also has some crepitation.  There is no effusion.  Hip motion is full and straight leg raise is negative.  Sensation and motor function are intact in her feet with palpable pulses on both sides.    Neurological: She is alert and oriented to person, place, and time.  Skin: Skin is warm and dry.  Psychiatric: She has a normal mood and affect. Her behavior is normal. Judgment and thought content normal.    Vital signs in last 24 hours:    Labs:   Estimated body mass index is 33.88 kg/m as calculated from the following:   Height as of 06/18/17: 5\' 5"  (1.651 m).   Weight as of 06/18/17: 92.4 kg (203 lb 9.6 oz).   Imaging Review Plain radiographs demonstrate severe degenerative joint disease of the left knee(s). The overall alignment isneutral. The bone quality appears to be good for age and reported activity level.  Assessment/Plan:  End stage primary arthritis, left knee   The patient history, physical examination, clinical judgment of the provider and imaging studies are consistent with end stage degenerative joint disease of the left knee(s) and total knee arthroplasty is deemed medically necessary. The treatment options including medical management, injection therapy arthroscopy and arthroplasty were discussed at length. The risks and benefits of total knee arthroplasty were presented and reviewed. The risks due to aseptic loosening, infection, stiffness, patella tracking problems, thromboembolic complications and other imponderables were discussed. The patient acknowledged the explanation, agreed to proceed with the plan and consent was signed. Patient is being admitted for inpatient treatment for surgery, pain control, PT, OT, prophylactic antibiotics, VTE prophylaxis, progressive ambulation and ADL's and discharge planning. The patient is planning to be discharged home with home health  services

## 2017-07-07 ENCOUNTER — Encounter (HOSPITAL_COMMUNITY): Payer: Self-pay | Admitting: Certified Registered"

## 2017-07-07 MED ORDER — TRANEXAMIC ACID 1000 MG/10ML IV SOLN
2000.0000 mg | INTRAVENOUS | Status: DC
  Filled 2017-07-07: qty 20

## 2017-07-07 NOTE — Progress Notes (Signed)
Please complete assessment DOS; lvm with pre-op instructions only according to pre-op checklist.

## 2017-07-07 NOTE — Anesthesia Preprocedure Evaluation (Deleted)
Anesthesia Evaluation  Patient identified by MRN, date of birth, ID band Patient awake    Reviewed: Allergy & Precautions, NPO status , Patient's Chart, lab work & pertinent test results  History of Anesthesia Complications (+) PONV  Airway Mallampati: II  TM Distance: >3 FB Neck ROM: Full    Dental  (+) Dental Advisory Given   Pulmonary neg pulmonary ROS,    Pulmonary exam normal breath sounds clear to auscultation       Cardiovascular hypertension, negative cardio ROS Normal cardiovascular exam Rhythm:Regular Rate:Normal     Neuro/Psych Depression negative neurological ROS     GI/Hepatic negative GI ROS, Neg liver ROS,   Endo/Other  Hypothyroidism   Renal/GU negative Renal ROS  negative genitourinary   Musculoskeletal  (+) Arthritis ,   Abdominal   Peds  (+) ATTENTION DEFICIT DISORDER WITHOUT HYPERACTIVITY Hematology  (+) anemia ,   Anesthesia Other Findings   Reproductive/Obstetrics                             Anesthesia Physical Anesthesia Plan  ASA: II  Anesthesia Plan: Spinal   Post-op Pain Management:    Induction:   PONV Risk Score and Plan: Treatment may vary due to age or medical condition and Propofol infusion  Airway Management Planned: Natural Airway and Nasal Cannula  Additional Equipment: None  Intra-op Plan:   Post-operative Plan:   Informed Consent: I have reviewed the patients History and Physical, chart, labs and discussed the procedure including the risks, benefits and alternatives for the proposed anesthesia with the patient or authorized representative who has indicated his/her understanding and acceptance.   Dental advisory given  Plan Discussed with: CRNA  Anesthesia Plan Comments:         Anesthesia Quick Evaluation

## 2017-07-08 ENCOUNTER — Inpatient Hospital Stay (HOSPITAL_COMMUNITY)
Admission: RE | Admit: 2017-07-08 | Payer: BLUE CROSS/BLUE SHIELD | Source: Ambulatory Visit | Admitting: Orthopaedic Surgery

## 2017-07-08 ENCOUNTER — Encounter (HOSPITAL_COMMUNITY): Admission: RE | Payer: Self-pay | Source: Ambulatory Visit

## 2017-07-08 SURGERY — ARTHROPLASTY, KNEE, TOTAL
Anesthesia: Spinal | Site: Knee | Laterality: Left

## 2017-07-21 ENCOUNTER — Ambulatory Visit (INDEPENDENT_AMBULATORY_CARE_PROVIDER_SITE_OTHER): Payer: Self-pay | Admitting: Orthopaedic Surgery

## 2017-07-22 ENCOUNTER — Encounter (INDEPENDENT_AMBULATORY_CARE_PROVIDER_SITE_OTHER): Payer: Self-pay | Admitting: Orthopaedic Surgery

## 2017-07-22 ENCOUNTER — Ambulatory Visit (INDEPENDENT_AMBULATORY_CARE_PROVIDER_SITE_OTHER): Payer: BLUE CROSS/BLUE SHIELD | Admitting: Orthopaedic Surgery

## 2017-07-22 DIAGNOSIS — M1712 Unilateral primary osteoarthritis, left knee: Secondary | ICD-10-CM | POA: Diagnosis not present

## 2017-07-22 NOTE — Progress Notes (Signed)
° °  Office Visit Note   Patient: Emily [name]           Date of Birth: 08-Oct-1955           MRN: [id number] Visit Date: 07/22/2017              Requested by: No referring provider defined for this encounter. PCP: System, Pcp Not In   Assessment & Plan: Visit Diagnoses:  1. Primary osteoarthritis of left knee     Plan: Impression is left knee degenerative joint disease end-stage.  I am unsure as to why she keeps bouncing back and forth between Dr. Rhona Raider and me.  We fitted her for a hinged knee brace today.  Follow-up as needed.  Follow-Up Instructions: Return if symptoms worsen or fail to improve.   Orders:  No orders of the defined types were placed in this encounter.  No orders of the defined types were placed in this encounter.     Procedures: No procedures performed   Clinical Data: No additional findings.   Subjective: Chief Complaint  Patient presents with   Left Knee - Pain    Patient comes in today for follow-up of her left knee degenerative joint disease.  She canceled her recent total knee replacement with Dr. Rhona Raider.  She comes in today requesting a hinged knee brace.    Review of Systems   Objective: Vital Signs: There were no vitals taken for this visit.  Physical Exam  Ortho Exam Left knee exam is stable. Specialty Comments:  No specialty comments available.  Imaging: No results found.   PMFS History: Patient Active Problem List   Diagnosis Date Noted   Pain in joint, ankle and foot 11/27/2012   Metatarsalgia of both feet 11/27/2012   Deformity of metatarsal 11/27/2012   Past Medical History:  Diagnosis Date   ADD (attention deficit disorder)    Anemia    Arthritis    Complication of anesthesia    Depression    Hypertension    Hypothyroidism    PONV (postoperative nausea and vomiting)     Family History  Problem Relation Age of Onset   Breast cancer Mother    Heart murmur Father     Past Surgical  History:  Procedure Laterality Date   CHOLECYSTECTOMY     DILATION AND CURETTAGE OF UTERUS     "several" per pt   NASAL SEPTUM SURGERY     TUBAL LIGATION     Social History   Occupational History   Not on file  Tobacco Use   Smoking status: Never Smoker   Smokeless tobacco: Never Used  Substance and Sexual Activity   Alcohol use: No   Drug use: No   Sexual activity: Not Currently    Birth control/protection: None

## 2017-09-01 ENCOUNTER — Telehealth (INDEPENDENT_AMBULATORY_CARE_PROVIDER_SITE_OTHER): Payer: Self-pay | Admitting: Radiology

## 2017-09-01 NOTE — Telephone Encounter (Signed)
Patient is aware CD of x-rays are ready for pick up at the front desk.

## 2018-02-19 DIAGNOSIS — N184 Chronic kidney disease, stage 4 (severe): Secondary | ICD-10-CM | POA: Insufficient documentation

## 2019-05-14 HISTORY — PX: ROBOTIC ASSISTED TOTAL HYSTERECTOMY WITH BILATERAL SALPINGO OOPHERECTOMY: SHX6086

## 2019-06-14 HISTORY — PX: IR IMAGING GUIDED PORT INSERTION: IMG5740

## 2019-09-02 DIAGNOSIS — D6481 Anemia due to antineoplastic chemotherapy: Secondary | ICD-10-CM | POA: Insufficient documentation

## 2021-06-18 ENCOUNTER — Telehealth: Payer: Self-pay | Admitting: *Deleted

## 2021-06-18 NOTE — Telephone Encounter (Signed)
Called and left the patient a message to call the office back. Patient needs to be scheduled for a new patient appt  °

## 2021-06-18 NOTE — Telephone Encounter (Signed)
Spoke with Emily [name] regarding her referral to GYN oncology. She has an appointment scheduled with Dr. Berline Lopes on 06/27/21 at 0900. Patient agrees to date and time. She has been provided with office address and location. She is also aware of our mask and visitor policy. Patient verbalized understanding and will call with any questions.

## 2021-06-25 ENCOUNTER — Telehealth: Payer: Self-pay

## 2021-06-25 NOTE — Telephone Encounter (Signed)
Patient called to reschedule her new patient appointment.  Rescheduled with Dr. Berline Lopes on 07/09/2021 at [place].

## 2021-06-27 ENCOUNTER — Ambulatory Visit: Payer: Self-pay | Admitting: Gynecologic Oncology

## 2021-07-06 ENCOUNTER — Encounter: Payer: Self-pay | Admitting: Gynecologic Oncology

## 2021-07-09 ENCOUNTER — Inpatient Hospital Stay: Payer: Self-pay | Admitting: Gynecologic Oncology

## 2021-08-02 ENCOUNTER — Encounter: Payer: Self-pay | Admitting: Gynecologic Oncology

## 2021-08-03 ENCOUNTER — Inpatient Hospital Stay: Payer: Medicare Other

## 2021-08-03 ENCOUNTER — Encounter: Payer: Self-pay | Admitting: Gynecologic Oncology

## 2021-08-03 ENCOUNTER — Inpatient Hospital Stay: Payer: Medicare Other | Attending: Gynecologic Oncology | Admitting: Gynecologic Oncology

## 2021-08-03 ENCOUNTER — Other Ambulatory Visit: Payer: Self-pay

## 2021-08-03 VITALS — BP 136/69 | HR 78 | Temp 97.1°F | Resp 20 | Wt 203.4 lb

## 2021-08-03 DIAGNOSIS — Z923 Personal history of irradiation: Secondary | ICD-10-CM | POA: Diagnosis not present

## 2021-08-03 DIAGNOSIS — Z90722 Acquired absence of ovaries, bilateral: Secondary | ICD-10-CM | POA: Insufficient documentation

## 2021-08-03 DIAGNOSIS — E785 Hyperlipidemia, unspecified: Secondary | ICD-10-CM | POA: Diagnosis not present

## 2021-08-03 DIAGNOSIS — Z9221 Personal history of antineoplastic chemotherapy: Secondary | ICD-10-CM | POA: Diagnosis not present

## 2021-08-03 DIAGNOSIS — M1990 Unspecified osteoarthritis, unspecified site: Secondary | ICD-10-CM | POA: Diagnosis not present

## 2021-08-03 DIAGNOSIS — F32A Depression, unspecified: Secondary | ICD-10-CM

## 2021-08-03 DIAGNOSIS — Z452 Encounter for adjustment and management of vascular access device: Secondary | ICD-10-CM | POA: Insufficient documentation

## 2021-08-03 DIAGNOSIS — I10 Essential (primary) hypertension: Secondary | ICD-10-CM

## 2021-08-03 DIAGNOSIS — Z79899 Other long term (current) drug therapy: Secondary | ICD-10-CM | POA: Insufficient documentation

## 2021-08-03 DIAGNOSIS — Z90710 Acquired absence of both cervix and uterus: Secondary | ICD-10-CM | POA: Diagnosis not present

## 2021-08-03 DIAGNOSIS — Z95828 Presence of other vascular implants and grafts: Secondary | ICD-10-CM

## 2021-08-03 DIAGNOSIS — Z8542 Personal history of malignant neoplasm of other parts of uterus: Secondary | ICD-10-CM

## 2021-08-03 DIAGNOSIS — C541 Malignant neoplasm of endometrium: Secondary | ICD-10-CM

## 2021-08-03 DIAGNOSIS — F3289 Other specified depressive episodes: Secondary | ICD-10-CM

## 2021-08-03 MED ORDER — CLONIDINE HCL 0.2 MG PO TABS: 0.2000 mg | ORAL_TABLET | Freq: Every day | ORAL | 3 refills | Status: DC

## 2021-08-03 MED ORDER — HEPARIN SOD (PORK) LOCK FLUSH 100 UNIT/ML IV SOLN
500.0000 [IU] | Freq: Once | INTRAVENOUS | Status: AC
  Administered 2021-08-03: 500 [IU] via INTRAVENOUS

## 2021-08-03 MED ORDER — BUPROPION HCL ER (SR) 150 MG PO TB12: 150.0000 mg | ORAL_TABLET | Freq: Two times a day (BID) | ORAL | 3 refills | Status: DC

## 2021-08-03 MED ORDER — PRAZOSIN HCL 5 MG PO CAPS: 5.0000 mg | ORAL_CAPSULE | Freq: Every day | ORAL | 3 refills | Status: DC

## 2021-08-03 MED ORDER — ATORVASTATIN CALCIUM 40 MG PO TABS: 40.0000 mg | ORAL_TABLET | Freq: Every day | ORAL | 3 refills | Status: DC

## 2021-08-03 MED ORDER — SODIUM CHLORIDE 0.9% FLUSH
10.0000 mL | INTRAVENOUS | Status: DC | PRN
  Administered 2021-08-03: 10 mL via INTRAVENOUS

## 2021-08-03 NOTE — Progress Notes (Signed)
GYNECOLOGIC ONCOLOGY NEW PATIENT CONSULTATION   Patient Name: Emily [name]  Patient Age: 65 y.o. Date of Service: 08/03/21 Referring Provider: Dr. 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   Patient is doing well and is NED on exam today.  Still has a port in place. Discuss plan for port flush today (as she is due) and possibility of port removal. She is interested in having port removed. Order placed today.  Will plan for imaging in 3 months given high-risk disease.  Per NCCN surveillance recommendations, will continue with planned surveillance visits every 3 months until 2-3 years after completion of treatment and then transition to visits every 6 months. Discussed signs and symptoms that could represent cancer recurrence and urged her to call to see me sooner if she develops any.   Has some vaginal shortening likely related to RT after surgery and no dilator use.   She has not been able to complete interview to set up Medicare and has run out of some of her baseline medications. I offered to send a prescription for 4 medications she needs for HTN, HLD, and depression to her pharmacy with a 3 months supply to bridge her to establishing with PCP now that she has been released from prison.   A copy of this note was sent to the patient's referring provider.   60 minutes of total time was spent for this patient encounter, including preparation, face-to-face counseling with the patient and coordination of care, and documentation of the encounter.   Jeral Pinch, MD  Division of Gynecologic Oncology  Department of Obstetrics and Gynecology  [facility]   ___________________________________________  Chief Complaint: Chief Complaint  Patient presents with   Endometrial cancer [facility])    History of Present Illness:  Emily [name] is a 65 y.o. y.o. female who is seen in consultation at the request of Dr. Skeet Latch for an evaluation of transfer of care with a history of advanced stage endometrial cancer.   Cancer history is noted below. 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 She has been NED since completing treatment in 01/2020.  Patient reports overall doing well.  She denies any vaginal bleeding or discharge.  She denies any abdominal or pelvic pain.  She endorses a good appetite without nausea or emesis.  Reports normal bowel function.  Has had some urinary frequency since radiation, improved some with time.  Denies any urinary incontinence.  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  Denies any issues with balance or walking.  Was recently discharged from prison.  Was getting her port flushed there.  Last time was in September.  She is working on getting a primary care provider, has a meeting next week to get Medicare set up.  Was given 3 months of medications which she has now run out of.  Patient is currently living in [place] at her sister's house.  She is planning to move back to [place] in the near future.  She is working part-time at [employer].  She has recently lost her father, and her sister had a stroke very soon after.  Her sister is now back in a nursing  home but it sounds like it is declining from a health standpoint.  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 Cervical stromal involvement and positive LVSI. ER/PR negative. HER2 positive. S/p definitive surgery  05/14/19. Adjuvant treatment: 6C carbo/taxol, EBRT with brachy boost. Adjuvant treatment completed 01/2020.  Early history: Pap smear completed in August 2019 which returned as atypical endometrial cells. In clinic sampling initially failed at the [facility] due to cervical stenosis. She was recommended to undergo hysteroscopy, D&C, ECC for sampling but surgery was cancelled twice. She subsequently underwent EmBx and ECC which showed on outside pathology FIGO grade 2 endometrial cancer. On [facility] review of path she was noted to have adenocarcinoma with gastrointestinal differentiation. The patient was referred to [place] at [facility]. She was initially seen in April 2020 at which time she declined surgical intervention. She subsequently had an IUD placed in May 2020 as she desired to delay surgery further in the setting of COVID. Given the [facility] pathology read of gastrointestinal differentiation, the patient was recommended to undergo a colonoscopy which she declined. Patient was subsequently scheduled for surgery in August 2020, but surgery was cancelled secondary to challenges with proper isolation following COVID screening via the [facility].  [name] had a pap smear completed in August 2019 which returned as atypical endometrial cells. In clinic sampling initially failed at the [facility] due to cervical stenosis. She was recommended to undergo hysteroscopy, D&C, ECC for sampling but surgery was cancelled twice. She subsequently underwent EmBx and ECC which showed on outside pathology FIGO grade 2 endometrial cancer. On [facility] review of path she was noted to have adenocarcinoma with gastrointestinal differentiation. The patient was referred to [place] at [facility]. She was initially seen in April 2020 at which time she declined surgical intervention. She subsequently had an IUD placed in May 2020 as she desired to delay surgery further in the setting of COVID. Given the [facility] pathology read of gastrointestinal differentiation, the patient  was recommended to undergo a colonoscopy which she declined. Patient was subsequently scheduled for surgery in August 2020, but surgery was cancelled secondary to challenges with proper isolation following COVID screening via the [facility].     Endometrial cancer ([place])  04/2018 Initial Diagnosis   Endometrial cancer ([place])   01/27/2019 Imaging   CT A/P: FINDINGS:  PELVIC/REPRODUCTIVE ORGANS: Intrauterine device is appropriately positioned within the uterine cavity. 1.2 cm intramural hyperattenuating ovoid mass in the right-sided myometrium of the lower uterine segment.  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 --Stable non-calcified nodule in the inferior lingula with atelectasis favored. Attention on followup imaging. --No evidence of metastatic disease in the abdomen or pelvis   05/14/2019 Surgery   Robotic-assisted total laparoscopic hysterectomy, bilateral salpingo-oophorectomy, left pelvic lymphadenectomy with right sentinel lymph node dissection  Operative Findings: Small anteverted uterus with 6cm posterior fibroid. IUD strings visible in the cervix; removed. Normal bilateral fallopian tubes. Left ovary with 2cm physiologic cyst; normal right ovary. Upper abdominal survey with filmy adhesions occluding view of right lobe of liver and diaphragm. Omentum adherent to anterior abdominal wall at the umbilicus. Normal appearing colon. No mapping of sentinel lymph node on left. Sentinel lymph node on right identified as right internal iliac artery.     05/14/2019 Pathology Results   Final Diagnosis  A: Lymph node, left pelvic, lymphadenectomy - Six lymph nodes with no evidence  of malignancy (0/6) B: Lymph node, right internal iliac, sentinel lymph node excision - One lymph node with metastatic carcinoma (1/1) C: Uterus with cervix and bilateral tubes and ovaries, hysterectomy with bilateral  salpingo-oophorectomy - High grade adenocarcinoma with gastrointestinal differentiation (see diagnostic comment) - FIGO Stage IIIC1 Additional findings: - Adenomyosis/Adenomyoma  - Leiomyoma with degenerative changes - Bilateral ovaries and fallopian tubes with no pathologic diagnosis     05/14/2019 Pathologic Stage   Stage: 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 Cervical stromal involvement and positive LVSI. ER/PR negative. HER2 positive. Plan: Adjuvant platinum/taxane combination chemotherapy with EBRT.    06/22/2019 - 10/19/2019 Chemotherapy   Carboplatin (AUC 6) and taxol (171m/m2)   11/08/2019 Imaging   Post chemotherapy treatment CT C/A/P IMPRESSION: --No evidence of thoracic metastasis. -- No evidence of metastatic disease within the abdomen or pelvis. -- Large fluid collection along left pelvic sidewall, likely lymphocele status post lymphadenectomy. -- Splenomegaly. -- Mild perivesicular stranding and anterior lateral thickening, possibly reactive versus inflammatory. Recommend correlation with urinalysis.    12/2019 - 01/19/2020 Radiation Therapy   Treatment Site: 180cGy x 28 = 5040cGy to the pelvis, IMRT, followed by 3 fractions of vaginal cylinder brachytherapy boost for an additional 1500cGy completed 01/19/20   09/18/2020 Imaging   CT C/A/P: -No evidence of metastatic disease within the abdomen or pelvis. -Redemonstrated simple fluid collection along the left pelvic sidewall favored to reflect a postoperative lymphocele. - Interval decrease in size of previously noted left lower lobe nodule with calcifications. No new or enlarging pulmonary nodule.     02/16/2021 Imaging   CT C/AP: --No evidence of metastatic disease in the abdomen or pelvis.  --Similar 8.0 cm low-attenuation left pelvic sidewall cystic lesion, likely a postoperative lymphocele.     PAST MEDICAL HISTORY:  Past Medical History:  Diagnosis Date    Endometrial cancer ([place]    History of incarceration    HLD (hyperlipidemia)    Hypertension    Osteoarthritis      PAST SURGICAL HISTORY:  Past Surgical History:  Procedure Laterality Date   CHOLECYSTECTOMY, LAPAROSCOPIC  2009   IR IMAGING GUIDED PORT INSERTION  06/14/2019   ROBOTIC ASSISTED TOTAL HYSTERECTOMY WITH BILATERAL SALPINGO OOPHERECTOMY  05/14/2019   with Left pelvic lymphadenectomy and right sentinel lymph node dissection by Dr. GCleopatra Cedar  TUBAL LIGATION      OB/GYN HISTORY:  OB History  Gravida Para Term Preterm AB Living  1 1          SAB IAB Ectopic Multiple Live Births               # Outcome Date GA Lbr Len/2nd Weight Sex Delivery Anes PTL Lv  1 Para             No LMP recorded.  Age at menarche: 89 Age at menopause: 89Hx of HRT: denies Hx of STDs: denies Last pap: 2020 - diagnosed adenocarcinoma of the uterus History of abnormal pap smears: yes, as above  SCREENING STUDIES:  Last mammogram: 2019  Last colonoscopy: 2020   MEDICATIONS: Outpatient Encounter Medications as of 08/03/2021  Medication Sig   acetaminophen (TYLENOL) 500 MG tablet Take 1,000 mg by mouth daily as needed.   atorvastatin (LIPITOR) 40 MG tablet Take 1 tablet (40 mg total) by mouth daily.   buPROPion (WELLBUTRIN SR) 150 MG 12 hr tablet Take 1 tablet (150 mg total) by mouth 2 (two) times daily.   cloNIDine (CATAPRES) 0.2  MG tablet Take 1 tablet (0.2 mg total) by mouth daily.   hydrOXYzine (VISTARIL) 50 MG capsule Take 50 mg by mouth at bedtime.   levothyroxine (SYNTHROID) 75 MCG tablet Take 75 mcg by mouth daily.   metoprolol tartrate (LOPRESSOR) 50 MG tablet Take 50 mg by mouth in the morning and at bedtime.   omeprazole (PRILOSEC) 20 MG capsule Take 1 capsule by mouth daily.   prazosin (MINIPRESS) 5 MG capsule Take 1 capsule (5 mg total) by mouth at bedtime.   [DISCONTINUED] atorvastatin (LIPITOR) 40 MG tablet Take 40 mg by mouth at bedtime.   [DISCONTINUED] buPROPion  (WELLBUTRIN XL) 300 MG 24 hr tablet Take 300 mg by mouth daily.   [DISCONTINUED] prazosin (MINIPRESS) 5 MG capsule Take 5 mg by mouth at bedtime.   Calcium Carbonate-Vit D-Min (CALTRATE 600+D PLUS MINERALS) 600-800 MG-UNIT CHEW Chew 2 tablets by mouth daily. (Patient not taking: Reported on 08/02/2021)   Cholecalciferol 25 MCG (1000 UT) tablet Take 1,000 Units by mouth daily. (Patient not taking: Reported on 08/02/2021)   gabapentin (NEURONTIN) 100 MG capsule Take 100 mg by mouth in the morning and at bedtime. (Patient not taking: Reported on 08/02/2021)   magnesium oxide (MAG-OX) 400 MG tablet Take 400 mg by mouth in the morning and at bedtime. (Patient not taking: Reported on 08/02/2021)   potassium chloride (KLOR-CON) 10 MEQ tablet Take 10 mEq by mouth daily. (Patient not taking: Reported on 08/02/2021)   sertraline (ZOLOFT) 50 MG tablet Take 150 mg by mouth at bedtime. (Patient not taking: Reported on 08/02/2021)   No facility-administered encounter medications on file as of 08/03/2021.    ALLERGIES:  No Known Allergies   FAMILY HISTORY:  Family History  Problem Relation Age of Onset   Breast cancer Mother    Lung cancer Father    Colon cancer Neg Hx    Ovarian cancer Neg Hx    Endometrial cancer Neg Hx    Pancreatic cancer Neg Hx    Prostate cancer Neg Hx      SOCIAL HISTORY:  Social Connections: Not on file    REVIEW OF SYSTEMS:  Denies appetite changes, fevers, chills, fatigue, unexplained weight changes. Denies hearing loss, neck lumps or masses, mouth sores, ringing in ears or voice changes. Denies cough or wheezing.  Denies shortness of breath. Denies chest pain or palpitations. Denies leg swelling. Denies abdominal distention, pain, blood in stools, constipation, diarrhea, nausea, vomiting, or early satiety. Denies pain with intercourse, dysuria, frequency, hematuria or incontinence. Denies hot flashes, pelvic pain, vaginal bleeding or vaginal discharge.   Denies  joint pain, back pain or muscle pain/cramps. Denies itching, rash, or wounds. Denies dizziness, headaches, numbness or seizures. Denies swollen lymph nodes or glands, denies easy bruising or bleeding. Denies anxiety, depression, confusion, or decreased concentration.  Physical Exam:  Vital Signs for this encounter:  Blood pressure 136/69, pulse 78, temperature (!) 97.1 F (36.2 C), resp. rate 20, weight 203 lb 6.4 oz (92.3 kg), SpO2 97 %. There is no height or weight on file to calculate BMI. General: Alert, oriented, no acute distress.  HEENT: Normocephalic, atraumatic. Sclera anicteric.  Chest: Clear to auscultation bilaterally. No wheezes, rhonchi, or rales. Cardiovascular: Regular rate and rhythm, no murmurs, rubs, or gallops.  Abdomen: Normoactive bowel sounds. Soft, nondistended, nontender to palpation. No masses or hepatosplenomegaly appreciated. No palpable fluid wave. Well healed incisions. Extremities: Grossly normal range of motion. Warm, well perfused. No edema bilaterally.  Skin: No rashes or lesions.  Lymphatics:  No cervical, supraclavicular, or inguinal adenopathy.  GU:  Normal external female genitalia.   No lesions. No discharge or bleeding.             Bladder/urethra:  No lesions or masses, well supported bladder             Vagina: Significantly atrophy, no lesions or masses. Radiation changes noted. Vagina shorted to a length of 4-5 cm.              Cervix/uterus: surgically absent.              Adnexa: No masses or nodularity appreciated.  Rectal: confirms above.   LABORATORY AND RADIOLOGIC DATA:  Outside medical records were reviewed to synthesize the above history, along with the history and physical obtained during the visit.   No results found for: WBC, HGB, HCT, PLT, GLUCOSE, CHOL, TRIG, HDL, LDLDIRECT, LDLCALC, ALT, AST, NA, K, CL, CREATININE, BUN, CO2, TSH, PSA, INR, GLUF, HGBA1C, MICROALBUR

## 2021-08-03 NOTE — Patient Instructions (Signed)
It was a pleasure meeting you today.  Please call the clinic at [phone] when you get home to give [place] your list of medications and doses.  I am happy to send in prescriptions to your pharmacy until you are able to establish with a primary care provider.  We will plan on continuing with visits every 3 months until 2-3 years after finishing treatment and then we will transition to visits every 6 months.  We will plan to get a CT scan at the time of your next visit given your high risk disease.  I have placed an order for your port to be removed.  Someone should call you from the radiologist office to schedule this.  If you develop any concerning symptoms before your next visit with me, such as vaginal bleeding, pelvic pain, change to bowel movements, or unintentional weight loss, please call to see me sooner.

## 2021-08-04 DIAGNOSIS — C541 Malignant neoplasm of endometrium: Secondary | ICD-10-CM | POA: Insufficient documentation

## 2021-08-04 DIAGNOSIS — M1990 Unspecified osteoarthritis, unspecified site: Secondary | ICD-10-CM | POA: Insufficient documentation

## 2021-08-04 DIAGNOSIS — Z789 Other specified health status: Secondary | ICD-10-CM | POA: Insufficient documentation

## 2021-08-04 DIAGNOSIS — E785 Hyperlipidemia, unspecified: Secondary | ICD-10-CM | POA: Insufficient documentation

## 2021-08-04 DIAGNOSIS — I10 Essential (primary) hypertension: Secondary | ICD-10-CM | POA: Insufficient documentation

## 2021-08-22 ENCOUNTER — Encounter: Payer: Self-pay | Admitting: Gynecologic Oncology

## 2021-10-23 ENCOUNTER — Telehealth: Payer: Self-pay | Admitting: *Deleted

## 2021-10-23 ENCOUNTER — Other Ambulatory Visit: Payer: Self-pay | Admitting: Gynecologic Oncology

## 2021-10-23 DIAGNOSIS — C541 Malignant neoplasm of endometrium: Secondary | ICD-10-CM

## 2021-10-23 NOTE — Telephone Encounter (Signed)
Spoke with the patient and she has her CT contrast for her scan on 3/27. Patient scheduled for a lab appt at 9 am on 3/27 ?

## 2021-10-23 NOTE — Progress Notes (Signed)
Bmet ordered for upcoming CT scan. ?

## 2021-10-25 ENCOUNTER — Encounter: Payer: Medicare Other | Admitting: Gynecologic Oncology

## 2021-10-29 ENCOUNTER — Encounter (HOSPITAL_COMMUNITY): Payer: Self-pay

## 2021-10-29 ENCOUNTER — Ambulatory Visit (HOSPITAL_COMMUNITY)
Admission: RE | Admit: 2021-10-29 | Discharge: 2021-10-29 | Disposition: A | Discharge status: Home / Self Care | Payer: Medicare Other | Source: Ambulatory Visit | Attending: Gynecologic Oncology | Admitting: Gynecologic Oncology

## 2021-10-29 ENCOUNTER — Other Ambulatory Visit: Payer: Self-pay

## 2021-10-29 ENCOUNTER — Inpatient Hospital Stay: Payer: Medicare Other | Attending: Gynecologic Oncology

## 2021-10-29 DIAGNOSIS — Z90722 Acquired absence of ovaries, bilateral: Secondary | ICD-10-CM | POA: Insufficient documentation

## 2021-10-29 DIAGNOSIS — Z90710 Acquired absence of both cervix and uterus: Secondary | ICD-10-CM | POA: Insufficient documentation

## 2021-10-29 DIAGNOSIS — Z9221 Personal history of antineoplastic chemotherapy: Secondary | ICD-10-CM | POA: Insufficient documentation

## 2021-10-29 DIAGNOSIS — C541 Malignant neoplasm of endometrium: Secondary | ICD-10-CM

## 2021-10-29 DIAGNOSIS — Z8542 Personal history of malignant neoplasm of other parts of uterus: Secondary | ICD-10-CM | POA: Insufficient documentation

## 2021-10-29 DIAGNOSIS — R350 Frequency of micturition: Secondary | ICD-10-CM | POA: Insufficient documentation

## 2021-10-29 DIAGNOSIS — Z923 Personal history of irradiation: Secondary | ICD-10-CM | POA: Insufficient documentation

## 2021-10-29 LAB — BASIC METABOLIC PANEL
Anion gap: 9 (ref 5–15)
BUN: 22 mg/dL (ref 8–23)
CO2: 22 mmol/L (ref 22–32)
Calcium: 8.6 mg/dL — ABNORMAL LOW (ref 8.9–10.3)
Chloride: 107 mmol/L (ref 98–111)
Creatinine, Ser: 1.04 mg/dL — ABNORMAL HIGH (ref 0.44–1.00)
GFR, Estimated: 60 mL/min — ABNORMAL LOW (ref >60)
Glucose, Bld: 112 mg/dL — ABNORMAL HIGH (ref 70–99)
Potassium: 3.8 mmol/L (ref 3.5–5.1)
Sodium: 138 mmol/L (ref 135–145)

## 2021-10-29 MED ORDER — IOHEXOL 9 MG/ML PO SOLN: 500.0000 mL | ORAL | Status: AC

## 2021-10-29 MED ORDER — IOHEXOL 9 MG/ML PO SOLN
ORAL | Status: AC
  Filled 2021-10-29: qty 1000

## 2021-10-29 MED ORDER — IOHEXOL 300 MG/ML  SOLN
100.0000 mL | Freq: Once | INTRAMUSCULAR | Status: AC | PRN
  Administered 2021-10-29: 100 mL via INTRAVENOUS

## 2021-11-01 ENCOUNTER — Encounter: Payer: Self-pay | Admitting: Gynecologic Oncology

## 2021-11-01 ENCOUNTER — Telehealth: Payer: Self-pay | Admitting: Oncology

## 2021-11-01 ENCOUNTER — Other Ambulatory Visit: Payer: Self-pay

## 2021-11-01 ENCOUNTER — Encounter: Payer: Self-pay | Admitting: Oncology

## 2021-11-01 ENCOUNTER — Inpatient Hospital Stay (HOSPITAL_BASED_OUTPATIENT_CLINIC_OR_DEPARTMENT_OTHER): Payer: Medicare Other | Admitting: Gynecologic Oncology

## 2021-11-01 VITALS — BP 134/85 | HR 90 | Temp 97.6°F | Resp 18 | Ht 64.96 in | Wt 216.6 lb

## 2021-11-01 DIAGNOSIS — C541 Malignant neoplasm of endometrium: Secondary | ICD-10-CM | POA: Diagnosis not present

## 2021-11-01 DIAGNOSIS — Z923 Personal history of irradiation: Secondary | ICD-10-CM | POA: Diagnosis not present

## 2021-11-01 DIAGNOSIS — R350 Frequency of micturition: Secondary | ICD-10-CM | POA: Diagnosis not present

## 2021-11-01 DIAGNOSIS — T8189XA Other complications of procedures, not elsewhere classified, initial encounter: Secondary | ICD-10-CM

## 2021-11-01 DIAGNOSIS — Z8542 Personal history of malignant neoplasm of other parts of uterus: Secondary | ICD-10-CM | POA: Diagnosis present

## 2021-11-01 DIAGNOSIS — Z9221 Personal history of antineoplastic chemotherapy: Secondary | ICD-10-CM | POA: Diagnosis not present

## 2021-11-01 DIAGNOSIS — Z90722 Acquired absence of ovaries, bilateral: Secondary | ICD-10-CM | POA: Diagnosis not present

## 2021-11-01 DIAGNOSIS — Z90710 Acquired absence of both cervix and uterus: Secondary | ICD-10-CM | POA: Insufficient documentation

## 2021-11-01 NOTE — Progress Notes (Signed)
Gynecologic Oncology Return Clinic Visit ? ?11/01/2021 ? ?Reason for Visit: Surveillance visit in the setting of endometrial cancer ? 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 Cervical stromal involvement and positive LVSI. ER/PR negative. HER2 positive. S/p definitive surgery 05/14/19. ?Adjuvant treatment: 6C carbo/taxol, EBRT with brachy boost. Adjuvant treatment completed 01/2020. ? ?Early history: ?Pap smear completed in August 2019 which returned as atypical endometrial cells. In clinic sampling initially failed at the [facility] due to cervical stenosis. She was recommended to undergo hysteroscopy, D&C, ECC for sampling but surgery was cancelled twice. She subsequently underwent EmBx and ECC which showed on outside pathology FIGO grade 2 endometrial cancer. On [facility] review of path she was noted to have adenocarcinoma with gastrointestinal differentiation. The patient was referred to [place] at [facility]. She was initially seen in April 2020 at which time she declined surgical intervention. She subsequently had an IUD placed in May 2020 as she desired to delay surgery further in the setting of COVID. Given the [facility] pathology read of gastrointestinal differentiation, the patient was recommended to undergo a colonoscopy which she declined. Patient was subsequently scheduled for surgery in August 2020, but surgery was cancelled secondary to challenges with proper isolation following COVID screening via the [facility].  [name] had a pap smear completed in August 2019 which returned as atypical endometrial cells. In clinic sampling initially failed at the [facility] due to cervical stenosis. She was recommended to undergo hysteroscopy, D&C, ECC for sampling but surgery was cancelled twice. She subsequently underwent EmBx and ECC which showed on outside pathology FIGO grade 2 endometrial cancer. On [facility] review of path she was noted to have  adenocarcinoma with gastrointestinal differentiation. The patient was referred to [place] at [facility]. She was initially seen in April 2020 at which time she declined surgical intervention. She subsequently had an IUD placed in May 2020 as she desired to delay surgery further in the setting of COVID. Given the [facility] pathology read of gastrointestinal differentiation, the patient was recommended to undergo a colonoscopy which she declined. Patient was subsequently scheduled for surgery in August 2020, but surgery was cancelled secondary to challenges with proper isolation following COVID screening via the [facility].   ?  ?Endometrial cancer ([place])  ?04/2018 Initial Diagnosis  ? Endometrial cancer [facility]) ?  ?01/27/2019 Imaging  ? CT A/P: ?FINDINGS:  ?PELVIC/REPRODUCTIVE ORGANS: Intrauterine device is appropriately positioned within the uterine cavity. 1.2 cm intramural hyperattenuating ovoid mass in the right-sided myometrium of the lower uterine segment.  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 ?--Stable non-calcified nodule in the inferior lingula with atelectasis favored. Attention on followup imaging. ?--No evidence of metastatic disease in the abdomen or pelvis ?  ?05/14/2019 Surgery  ? Robotic-assisted total laparoscopic hysterectomy, bilateral salpingo-oophorectomy, left pelvic lymphadenectomy with right sentinel lymph node dissection ? ?Operative Findings: ?Small anteverted uterus with 6cm posterior fibroid. IUD strings visible in the cervix; removed. Normal bilateral fallopian tubes. Left ovary with 2cm physiologic cyst; normal right ovary. Upper abdominal survey with filmy adhesions occluding view of right lobe of liver and diaphragm. Omentum adherent to anterior abdominal wall at the umbilicus. Normal appearing colon. No mapping of sentinel lymph node on left. Sentinel lymph node on right identified as right internal  iliac artery. ?  ?  ?05/14/2019 Pathology Results  ? Final Diagnosis  ?A: Lymph node, left pelvic, lymphadenectomy ?-  Six lymph nodes with no evidence of malignancy (0/6) ?B: Lymph node, right internal iliac, sentinel lymph node excision ?- One lymph node with metastatic carcinoma (1/1) ?C: Uterus with cervix and bilateral tubes and ovaries, hysterectomy with bilateral salpingo-oophorectomy ?- High grade adenocarcinoma with gastrointestinal differentiation (see diagnostic comment) ?- FIGO Stage IIIC1 ?Additional findings: ?- Adenomyosis/Adenomyoma  ?- Leiomyoma with degenerative changes ?- Bilateral ovaries and fallopian tubes with no pathologic diagnosis  ? ?  ?05/14/2019 Pathologic Stage  ? 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 Cervical stromal involvement and positive LVSI. ER/PR negative. HER2 positive. ?Plan: Adjuvant platinum/taxane combination chemotherapy with EBRT.  ?  ?06/22/2019 - 10/19/2019 Chemotherapy  ? Carboplatin (AUC 6) and taxol (127m/m2) ?  ?11/08/2019 Imaging  ? Post chemotherapy treatment CT C/A/P ?IMPRESSION: ?--No evidence of thoracic metastasis. ?-- No evidence of metastatic disease within the abdomen or pelvis. ?-- Large fluid collection along left pelvic sidewall, likely lymphocele status post lymphadenectomy. ?-- Splenomegaly. ?-- Mild perivesicular stranding and anterior lateral thickening, possibly reactive versus inflammatory. Recommend correlation with urinalysis.  ?  ?12/2019 - 01/19/2020 Radiation Therapy  ? Treatment Site: 180cGy x 28 = 5040cGy to the pelvis, IMRT, followed by 3 fractions of vaginal cylinder brachytherapy boost for an additional 1500cGy completed 01/19/20 ?  ?09/18/2020 Imaging  ? CT C/A/P: ?-No evidence of metastatic disease within the abdomen or pelvis. ?-Redemonstrated simple fluid collection along the left pelvic sidewall favored to reflect a postoperative lymphocele. ?- Interval decrease in size of previously noted  left lower lobe nodule with calcifications. No new or enlarging pulmonary nodule.   ?  ?02/16/2021 Imaging  ? CT C/AP: ?--No evidence of metastatic disease in the abdomen or pelvis.  ?--Similar 8.0 cm low-attenuation left pelvic sidewall cystic lesion, likely a postoperative lymphocele. ?  ? ? ?Interval History: ?Doing well.  Denies any vaginal bleeding or discharge.  Continues to have urinary frequency and feels that this has increased since I last saw her.  She now feels that she goes to the bathroom about every 15 minutes.  Denies any associated symptoms including dysuria or hematuria. ? ?Reports regular bowel function. ? ?Has sold her sister's house in the interim and [place] ? ?[facility] ? ?Past Medical/Surgical History: ?Past Medical History:  ?Diagnosis Date  ? ADD (attention deficit disorder)   ? Anemia   ? Arthritis   ? Complication of anesthesia   ? Depression   ? [place]   ? History of incarceration   ? HLD (hyperlipidemia)   ? Hypertension   ? Hypothyroidism   ? Osteoarthritis   ? PONV (postoperative nausea and vomiting)   ? ? ?Past Surgical History:  ?Procedure Laterality Date  ? CHOLECYSTECTOMY    ? CHOLECYSTECTOMY, LAPAROSCOPIC  2009  ? DILATION AND CURETTAGE OF UTERUS    ? "several" per pt  ? IR IMAGING GUIDED PORT INSERTION  06/14/2019  ? NASAL SEPTUM SURGERY    ? ROBOTIC ASSISTED TOTAL HYSTERECTOMY WITH BILATERAL SALPINGO OOPHERECTOMY  05/14/2019  ? with Left pelvic lymphadenectomy and right sentinel lymph node dissection by Dr. GCleopatra Cedar ? TUBAL LIGATION    ? ? ?Family History  ?Problem Relation Age of Onset  ? Breast cancer Mother   ? Lung cancer Father   ? Colon cancer Neg Hx   ? Ovarian cancer Neg Hx   ?  Endometrial cancer Neg Hx   ? Pancreatic cancer Neg Hx   ? Prostate cancer Neg Hx   ? Heart murmur Father   ? ? ?Social History   ? ?Socioeconomic History  ? Marital status: Single  ?  Spouse name: Not on file  ? Number of children: Not on file  ? Years of education: Not on file  ? Highest education level: Not on file  ?Occupational History  ?

## 2021-11-01 NOTE — Telephone Encounter (Signed)
Primary care appointment scheduled with Emily Mire, NP at [facility] on 01/08/22.  Called Emily [name] and advised her of appointment.  She was given the number for [facility] to call to see if she can get an earlier appointment or be added to a cancellation list. ?

## 2021-11-01 NOTE — Patient Instructions (Addendum)
It was good to see you today.  I do not see or feel any evidence of cancer recurrence on your exam. ? ?I will see you back for a visit in 3 months unless something changes before then.  If you have any new and concerning symptoms, please call to see me sooner. ? ?Santiago Glad, our nurse navigator, will work on getting you into a primary care provider here in the [place] area. ? ?I have placed a referral to urogynecology for you to meet with a specialist about your urinary symptoms. ? ? ?

## 2021-11-06 ENCOUNTER — Telehealth: Payer: Self-pay | Admitting: *Deleted

## 2021-11-06 NOTE — Telephone Encounter (Signed)
Referral sent 

## 2021-11-06 NOTE — Telephone Encounter (Signed)
Spoke with pt to inform her that we will send referral to [facility]. She verbalized understanding.  ?

## 2021-11-08 ENCOUNTER — Inpatient Hospital Stay: Payer: Medicare Other | Attending: Gynecologic Oncology

## 2021-11-09 ENCOUNTER — Ambulatory Visit
Admission: RE | Admit: 2021-11-09 | Discharge: 2021-11-09 | Disposition: A | Discharge status: Home / Self Care | Payer: Medicare Other | Source: Ambulatory Visit | Attending: Emergency Medicine | Admitting: Emergency Medicine

## 2021-11-09 VITALS — BP 136/85 | HR 82 | Temp 97.9°F | Resp 20

## 2021-11-09 DIAGNOSIS — R350 Frequency of micturition: Secondary | ICD-10-CM | POA: Insufficient documentation

## 2021-11-09 DIAGNOSIS — R8290 Unspecified abnormal findings in urine: Secondary | ICD-10-CM | POA: Insufficient documentation

## 2021-11-09 LAB — POCT URINALYSIS DIP (MANUAL ENTRY)
Bilirubin, UA: NEGATIVE
Glucose, UA: NEGATIVE mg/dL
Nitrite, UA: NEGATIVE
Protein Ur, POC: 100 mg/dL — AB
Spec Grav, UA: >=1.03 — AB (ref 1.010–1.025)
Urobilinogen, UA: 0.2 E.U./dL
pH, UA: 6 (ref 5.0–8.0)

## 2021-11-09 MED ORDER — SULFAMETHOXAZOLE-TRIMETHOPRIM 800-160 MG PO TABS: 1.0000 | ORAL_TABLET | Freq: Two times a day (BID) | ORAL | 0 refills | Status: AC

## 2021-11-09 NOTE — ED Provider Notes (Signed)
?[facility] ? ? ? ?CSN: [id number] ?Arrival date & time: 11/09/21  1250 ?  ? ?HISTORY  ? ?Chief Complaint  ?Patient presents with  ? Groin Pain  ?  Frequent urination like every 15 minutes.Upper right thigh and groin pain - Entered by patient  ? Urinary Frequency  ? ?HPI ?Emily [name] is a 66 y.o. female. Patient complains of increased frequency of urination that began to occur when she began getting radiation for endometrial cancer, now status post total hysterectomy with by bilateral salpingectomy and bilateral oophorectomy.  Patient was seen by her gynecology oncologist on November 01, 2021 and reported the same complaint, her gynecology oncologist felt that her complaint may be due to chronic changes related to radiation.  It was recommended that she begin vaginal estrogen to see if that helped.  Her oncologist also offered to refer her to see a urogynecologist for further work-up and a discussion of treatment options.  Today, patient also complains of right groin pain and thigh pain that began 5 days ago, this complaint is new.  Patient is ambulating without assistance in the clinic today. ? ?The history is provided by the patient. Given urinary frequency, I suspect that she may have some chronic changes related to radiation.  Symptoms developed after radiation treatment.  It may be worth trying vaginal estrogen to see if this helps.  I offered to send a referral to our urogynecologist for further work-up and discussion of treatment options. ?Past Medical History:  ?Diagnosis Date  ? ADD (attention deficit disorder)   ? Anemia   ? Arthritis   ? Complication of anesthesia   ? Depression   ? Endometrial cancer ([place])   ? History of incarceration   ? HLD (hyperlipidemia)   ? Hypertension   ? Hypothyroidism   ? Osteoarthritis   ? PONV (postoperative nausea and vomiting)   ? ?Patient Active Problem List  ? Diagnosis Date Noted  ? Endometrial cancer ([place]) 08/04/2021  ? History of incarceration 08/04/2021  ?  HLD (hyperlipidemia) 08/04/2021  ? HTN (hypertension) 08/04/2021  ? Osteoarthritis 08/04/2021  ? Pain in joint, ankle and foot 11/27/2012  ? Metatarsalgia of both feet 11/27/2012  ? 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 ?Past Surgical History:  ?Procedure Laterality Date  ? CHOLECYSTECTOMY    ? CHOLECYSTECTOMY, LAPAROSCOPIC  2009  ? DILATION AND CURETTAGE OF UTERUS    ? "several" per pt  ? IR IMAGING GUIDED PORT INSERTION  06/14/2019  ? NASAL SEPTUM SURGERY    ? ROBOTIC ASSISTED TOTAL HYSTERECTOMY WITH BILATERAL SALPINGO OOPHERECTOMY  05/14/2019  ? with Left pelvic lymphadenectomy and right sentinel lymph node dissection by Dr. Cleopatra Cedar  ? TUBAL LIGATION    ? ?OB History   ? ? Gravida  ?1  ? Para  ?1  ? Term  ?0  ? Preterm  ?0  ? AB  ?0  ? Living  ?   ?  ? ? SAB  ?0  ? IAB  ?0  ? Ectopic  ?0  ? Multiple  ?   ? Live Births  ?   ?   ?  ?  ? ?Home Medications   ? ?Prior to Admission medications   ?Medication Sig Start Date End Date Taking? Authorizing Provider  ?acetaminophen (TYLENOL) 500 MG tablet Take 1,000 mg by mouth daily as needed.    [provider]  ?ALPRAZolam Duanne Moron) 1 MG tablet Take 1 mg 3 (three) times daily as needed by mouth  for sleep.    [provider]  ?atorvastatin (LIPITOR) 40 MG tablet Take 40 mg every evening by mouth.  03/03/17   [provider]  ?atorvastatin (LIPITOR) 40 MG tablet Take 1 tablet (40 mg total) by mouth daily. 08/03/21   Lafonda Mosses, MD  ?buPROPion [facility] SR) 150 MG 12 hr tablet Take 1 tablet (150 mg total) by mouth 2 (two) times daily. 08/03/21   Lafonda Mosses, MD  ?buPROPion (WELLBUTRIN XL) 300 MG 24 hr tablet Take 300 mg by mouth daily.    [provider]  ?cloNIDine (CATAPRES) 0.2 MG tablet Take 1 tablet (0.2 mg total) by mouth daily. 08/03/21   Lafonda Mosses, MD  ?diclofenac sodium (VOLTAREN) 1 % GEL Apply 1 application 3 (three) times daily as needed topically for pain. 05/02/17   [provider]   ?hydrOXYzine (VISTARIL) 50 MG capsule Take 50 mg by mouth at bedtime.    [provider]  ?ibuprofen (ADVIL,MOTRIN) 200 MG tablet Take 400 mg every 6 (six) hours as needed by mouth for headache or mild pain.    [provider]  ?levothyroxine (SYNTHROID) 75 MCG tablet Take 75 mcg by mouth daily.    [provider]  ?levothyroxine (SYNTHROID, LEVOTHROID) 50 MCG tablet Take 50 mcg daily by mouth. 03/12/17 03/12/18  [provider]  ?metoprolol tartrate (LOPRESSOR) 50 MG tablet Take 50 mg by mouth in the morning and at bedtime.    [provider]  ?omeprazole (PRILOSEC) 20 MG capsule Take 1 capsule by mouth daily. 10/16/20 10/16/21  [provider]  ?oxymetazoline (AFRIN) 0.05 % nasal spray Place 2 sprays 2 (two) times daily as needed into the nose for congestion.     [provider]  ?prazosin (MINIPRESS) 5 MG capsule Take 5 mg at bedtime by mouth.  03/25/17   [provider]  ?prazosin (MINIPRESS) 5 MG capsule Take 1 capsule (5 mg total) by mouth at bedtime. 08/03/21   Lafonda Mosses, MD  ? ?Family History ?Family History  ?Problem Relation Age of Onset  ? Breast cancer Mother   ? Lung cancer Father   ? Colon cancer Neg Hx   ? Ovarian cancer Neg Hx   ? Endometrial cancer Neg Hx   ? Pancreatic cancer Neg Hx   ? Prostate cancer Neg Hx   ? Heart murmur Father   ? ?Social History ?Social History  ? ?Tobacco Use  ? Smoking status: Never  ? Smokeless tobacco: Never  ?Vaping Use  ? Vaping Use: Never used  ?Substance Use Topics  ? Alcohol use: Not Currently  ? Drug use: Never  ? ?Allergies   ?Patient has no known allergies. ? ?Review of Systems ?Review of Systems ?Pertinent findings noted in history of present illness.  ? ?Physical Exam ?Triage Vital Signs ?ED Triage Vitals  ?Enc Vitals Group  ?   BP 06/01/21 0827 (!) 147/82  ?   Pulse Rate 06/01/21 0827 72  ?   Resp 06/01/21 0827 18  ?   Temp 06/01/21 0827 98.3 ?F (36.8 ?C)  ?   Temp Source 06/01/21 0827  Oral  ?   SpO2 06/01/21 0827 98 %  ?   Weight --   ?   Height --   ?   Head Circumference --   ?   Peak Flow --   ?   Pain Score 06/01/21 0826 5  ?   Pain Loc --   ?   Pain  Edu? --   ?   Excl. in [place]? --   ?No data found. ? ?Updated Vital Signs ?BP 136/85 (BP Location: Right Arm)   Pulse 82   Temp 97.9 ?F (36.6 ?C) (Oral)   Resp 20   SpO2 95%  ? ?Physical Exam ?Vitals and nursing note reviewed.  ?Constitutional:   ?   General: She is not in acute distress. ?   Appearance: Normal appearance. She is not ill-appearing.  ?HENT:  ?   Head: Normocephalic and atraumatic.  ?   Mouth/Throat:  ?   Comments: Patient is edentulous ?Eyes:  ?   General: Lids are normal.     ?   Right eye: No discharge.     ?   Left eye: No discharge.  ?   Extraocular Movements: Extraocular movements intact.  ?   Conjunctiva/sclera: Conjunctivae normal.  ?   Right eye: Right conjunctiva is not injected.  ?   Left eye: Left conjunctiva is not injected.  ?Neck:  ?   Trachea: Trachea and phonation normal.  ?Cardiovascular:  ?   Rate and Rhythm: Normal rate and regular rhythm.  ?   Pulses: Normal pulses.  ?   Heart sounds: Normal heart sounds. No murmur heard. ?  No friction rub. No gallop.  ?Pulmonary:  ?   Effort: Pulmonary effort is normal. No accessory muscle usage, prolonged expiration or respiratory distress.  ?   Breath sounds: Normal breath sounds. No stridor, decreased air movement or transmitted upper airway sounds. No decreased breath sounds, wheezing, rhonchi or rales.  ?Chest:  ?   Chest wall: No tenderness.  ?Abdominal:  ?   General: Abdomen is flat. Bowel sounds are normal. There is no distension.  ?   Palpations: Abdomen is soft.  ?   Tenderness: There is abdominal tenderness in the suprapubic area. There is no right CVA tenderness or left CVA tenderness.  ?   Hernia: No hernia is present.  ?Musculoskeletal:     ?   General: Normal range of motion.  ?   Cervical back: Normal range of motion and neck supple. Normal range of motion.   ?Lymphadenopathy:  ?   Cervical: No cervical adenopathy.  ?Skin: ?   General: Skin is warm and dry.  ?   Findings: No erythema or rash.  ?Neurological:  ?   General: No focal deficit present.  ?   Mental Status:

## 2021-11-09 NOTE — ED Triage Notes (Addendum)
Pt states she is an oncology patient. She c/o right groin and thigh pain that began this Sunday.  ? ?Pt c/o frequent urination that began when she began getting radiation. ? ?Pt states she took Advil around 1200 today. ?

## 2021-11-09 NOTE — Discharge Instructions (Addendum)
I have initiated a request for you to be seen as soon as possible with a primary care provider for your issues of right groin pain and increased frequency of urination.  You should receive a phone call within the next 3 to 4 days to help you get an appointment set up soon as possible. ? ?The urinalysis that we performed in the clinic today was abnormal.  Urine culture will be performed per our protocol.  The result of the urine culture will be available in the next 3 to 5 days and will be posted to your MyChart account.  If there is an abnormal finding, you will be contacted by phone and advised of further treatment recommendations, if any. ?  ?You were advised to begin antibiotics today because you are having active symptoms of a urinary tract infection.  It is very important that you take all doses exactly as prescribed.  Incomplete antibiotic therapy can cause worsening urinary tract infection that can become aggressive, reach the level of your kidneys causing kidney infection and possible hospitalization. ?  ?If you receive a phone call advising you that your urine culture is negative but you are feeling significantly better after starting antibiotics, I recommend that you complete the full course of antibiotics as prescribed.   ?  ?If you receive a phone call advising you that your urine culture is negative and you are not feeling significantly better after starting antibiotics, please feel free to discontinue antibiotics as they are no longer indicated. ?  ?Thank you for visiting urgent care today.  I appreciate the opportunity to participate in your care. ? ?

## 2021-11-11 LAB — URINE CULTURE

## 2021-11-15 ENCOUNTER — Other Ambulatory Visit: Payer: Self-pay | Admitting: Family Medicine

## 2021-11-15 ENCOUNTER — Encounter: Payer: Self-pay | Admitting: Family Medicine

## 2021-11-15 ENCOUNTER — Ambulatory Visit (INDEPENDENT_AMBULATORY_CARE_PROVIDER_SITE_OTHER): Payer: Medicare Other | Admitting: Family Medicine

## 2021-11-15 VITALS — BP 127/75 | HR 79 | Ht 65.0 in | Wt 212.6 lb

## 2021-11-15 DIAGNOSIS — Z780 Asymptomatic menopausal state: Secondary | ICD-10-CM

## 2021-11-15 DIAGNOSIS — F3289 Other specified depressive episodes: Secondary | ICD-10-CM | POA: Diagnosis not present

## 2021-11-15 DIAGNOSIS — K2100 Gastro-esophageal reflux disease with esophagitis, without bleeding: Secondary | ICD-10-CM

## 2021-11-15 DIAGNOSIS — E039 Hypothyroidism, unspecified: Secondary | ICD-10-CM

## 2021-11-15 DIAGNOSIS — I10 Essential (primary) hypertension: Secondary | ICD-10-CM | POA: Diagnosis not present

## 2021-11-15 DIAGNOSIS — Z1231 Encounter for screening mammogram for malignant neoplasm of breast: Secondary | ICD-10-CM

## 2021-11-15 DIAGNOSIS — E782 Mixed hyperlipidemia: Secondary | ICD-10-CM

## 2021-11-15 DIAGNOSIS — Z13820 Encounter for screening for osteoporosis: Secondary | ICD-10-CM

## 2021-11-15 DIAGNOSIS — R350 Frequency of micturition: Secondary | ICD-10-CM

## 2021-11-15 DIAGNOSIS — R748 Abnormal levels of other serum enzymes: Secondary | ICD-10-CM

## 2021-11-15 MED ORDER — PRAZOSIN HCL 5 MG PO CAPS: 5.0000 mg | ORAL_CAPSULE | Freq: Every day | ORAL | 1 refills | Status: DC

## 2021-11-15 MED ORDER — BUPROPION HCL ER (SR) 150 MG PO TB12: 150.0000 mg | ORAL_TABLET | Freq: Two times a day (BID) | ORAL | 3 refills | Status: DC

## 2021-11-15 MED ORDER — TROSPIUM CHLORIDE 20 MG PO TABS: 20.0000 mg | ORAL_TABLET | Freq: Two times a day (BID) | ORAL | 1 refills | Status: DC

## 2021-11-15 MED ORDER — ATORVASTATIN CALCIUM 40 MG PO TABS: 40.0000 mg | ORAL_TABLET | Freq: Every evening | ORAL | 1 refills | Status: DC

## 2021-11-15 MED ORDER — CLONIDINE HCL 0.2 MG PO TABS: 0.2000 mg | ORAL_TABLET | Freq: Every day | ORAL | 3 refills | Status: DC

## 2021-11-15 MED ORDER — METOPROLOL TARTRATE 50 MG PO TABS: 50.0000 mg | ORAL_TABLET | Freq: Two times a day (BID) | ORAL | 1 refills | Status: DC

## 2021-11-15 MED ORDER — OMEPRAZOLE 20 MG PO CPDR: 20.0000 mg | DELAYED_RELEASE_CAPSULE | Freq: Every day | ORAL | 11 refills | Status: DC

## 2021-11-15 MED ORDER — LEVOTHYROXINE SODIUM 75 MCG PO TABS: 75.0000 ug | ORAL_TABLET | Freq: Every day | ORAL | 1 refills | Status: DC

## 2021-11-15 MED ORDER — HYDROXYZINE PAMOATE 50 MG PO CAPS: 50.0000 mg | ORAL_CAPSULE | Freq: Every day | ORAL | 1 refills | Status: DC

## 2021-11-15 NOTE — Patient Instructions (Signed)
Thank you for choosing [facility] at [facility] for your Primary Care needs. I am excited for the opportunity to partner with you to meet your health care goals. It was a pleasure meeting you today! ? ? ? ?Information on diet, exercise, and health maintenance recommendations are listed below. This is information to help you be sure you are on track for optimal health and monitoring.  ? ?Please look over this and let us know if you have any questions or if you have completed any of the health maintenance outside of [place] so that we can be sure your records are up to date.  ?___________________________________________________________ ? ?MyChart:  ?For all urgent or time sensitive needs we ask that you please call the office to avoid delays. Our number is [phone]. ?MyChart is not constantly monitored and due to the large volume of messages a day, replies may take up to 72 business hours. ? ?MyChart Policy: ?MyChart allows for you to see your visit notes, after visit summary, provider recommendations, lab and tests results, make an appointment, request refills, and contact your provider or the office for non-urgent questions or concerns. Providers are seeing patients during normal business hours and do not have built in time to review MyChart messages.  ?We ask that you allow a minimum of 3 business days for responses to [employer]. For this reason, please do not send urgent requests through [place]. Please call the office at [phone]. ?New and ongoing conditions may require a visit. We have virtual and in-person visits available for your convenience.  ?Complex MyChart concerns may require a visit. Your provider may request you schedule a virtual or in-person visit to ensure we are providing the best care possible. ?MyChart messages sent after 11:00 AM on Friday will not be received by the provider until Monday morning.  ?  ?Lab and Test Results: ?You will receive your lab and  test results on MyChart as soon as they are completed and results have been sent by the lab or testing facility. Due to this service, you will receive your results BEFORE your provider.  ?I review lab and test results each morning prior to seeing patients. Some results require collaboration with other providers to ensure you are receiving the most appropriate care. For this reason, we ask that you please allow a minimum of 3-5 business days from the time that ALL results have been received for your provider to receive and review lab and test results and contact you about these.  ?Most lab and test result comments from the provider will be sent through [place]. Your provider may recommend changes to the plan of care, follow-up visits, repeat testing, ask questions, or request an office visit to discuss these results. You may reply directly to this message or call the office to provide information for the provider or set up an appointment. ?In some instances, you will be called with test results and recommendations. Please let us know if this is preferred and we will make note of this in your chart to provide this for you.    ?If you have not heard a response to your lab or test results in 5 business days from all results returning to [place], please call the office to let us know. We ask that you please avoid calling prior to this time unless there is an emergent concern. Due to high call volumes, this can delay the resulting process. ? ?After Hours: ?For all non-emergency after hours  needs, please call the office at [phone] and select the option to reach the on-call  service. On-call services are shared between multiple [place] offices and therefore it will not be possible to speak directly with your provider. On-call providers may provide medical advice and recommendations, but are unable to provide refills for maintenance medications.  ?For all emergency or urgent medical needs after normal business  hours, we recommend that you seek care at the closest Urgent Care or Emergency Department to ensure appropriate treatment in a timely manner.  ?[facility] [place] has a 24 hour emergency room located on the ground floor for your convenience.  ? ?Urgent Concerns During the Business Day ?Providers are seeing patients from 8AM to [place] with a busy schedule and are most often not able to respond to non-urgent calls until the end of the day or the next business day. ?If you should have URGENT concerns during the day, please call and speak to the nurse or schedule a same day appointment so that we can address your concern without delay.  ? ?Thank you, again, for choosing me as your health care partner. I appreciate your trust and look forward to learning more about you.  ? ?[employer]. Olevia Bowens, DNP, FNP-C ? ?___________________________________________________________ ? ?Health Maintenance Recommendations ?Screening Testing ?Mammogram ?Every 1-2 years based on history and risk factors ?Starting at age 42 ?Pap Smear ?Ages 21-39 every 3 years ?Ages 36-65 every 5 years with HPV testing ?More frequent testing may be required based on results and history ?Colon Cancer Screening ?Every 1-10 years based on test performed, risk factors, and history ?Starting at age 17 ?Bone Density Screening ?Every 2-10 years based on history ?Starting at age 3 for women ?Recommendations for men differ based on medication usage, history, and risk factors ?AAA Screening ?One time ultrasound ?Men 82-35 years old who have ever smoked ?Lung Cancer Screening ?Low Dose Lung CT every 12 months ?Age 37-80 years with a 20 pack-year smoking history who still smoke or who have quit within the last 15 years ? ?Screening Labs ?Routine  Labs: Complete Blood Count (CBC), Complete Metabolic Panel (CMP), Cholesterol (Lipid Panel) ?Every 6-12 months based on history and medications ?May be recommended more frequently based on current conditions or  previous results ?Hemoglobin A1c Lab ?Every 3-12 months based on history and previous results ?Starting at age 67 or earlier with diagnosis of diabetes, high cholesterol, BMI >26, and/or risk factors ?Frequent monitoring for patients with diabetes to ensure blood sugar control ?Thyroid Panel (TSH w/ T3 & T4) ?Every 6 months based on history, symptoms, and risk factors ?May be repeated more often if on medication ?HIV ?One time testing for all patients 61 and older ?May be repeated more frequently for patients with increased risk factors or exposure ?Hepatitis C ?One time testing for all patients 54 and older ?May be repeated more frequently for patients with increased risk factors or exposure ?Gonorrhea, Chlamydia ?Every 12 months for all sexually active persons 13-24 years ?Additional monitoring may be recommended for those who are considered high risk or who have symptoms ?PSA ?Men 33-65 years old with risk factors ?Additional screening may be recommended from age 22-69 based on risk factors, symptoms, and history ? ?Vaccine Recommendations ?Tetanus Booster ?All adults every 10 years ?Flu Vaccine ?All patients 6 months and older every year ?COVID Vaccine ?All patients 12 years and older ?Initial dosing with booster ?May recommend additional booster based on age and health history ?HPV Vaccine ?2 doses all  patients age 38-26 ?Dosing may be considered for patients over 26 ?Shingles Vaccine (Shingrix) ?2 doses all adults 50 years and older ?Pneumonia (Pneumovax 23) ?All adults 52 years and older ?May recommend earlier dosing based on health history ?Pneumonia (Prevnar 65) ?All adults 53 years and older ?Dosed 1 year after Pneumovax 23 ?Pneumonia (Prevnar 41) ?All adults 78 years and older (adults 19-37 with certain conditions or risk factors) ?1 dose  ?For those who have no received Prevnar 13 vaccine previously ? ? ?Additional Screening, Testing, and Vaccinations may be recommended on an individualized basis based on  family history, health history, risk factors, and/or exposure.  ?__________________________________________________________ ? ?Diet Recommendations for All Patients ? ?I recommend that all patients maintain a di

## 2021-11-15 NOTE — Progress Notes (Signed)
? ?______________________________________________________________________ ? ?HPI ?Emily [name] is a 66 y.o. female presenting to [place] at [facility] today to establish care. She recently moved back here from [place]. She had been seeing an oncologist in [facility] for all of her care (uterine cancer).  ? ?Patient Care Team: ?Terrilyn Saver, NP as PCP - General (Family Medicine) ?Pcp, No ? ?Health Maintenance  ?Topic Date Due  ? COVID-19 Vaccine (1) Never done  ? Pneumonia Vaccine (1 - PCV) Never done  ? HIV Screening  Never done  ? Hepatitis C Screening: USPSTF Recommendation to screen - Ages 22-79 yo.  Never done  ? Tetanus Vaccine  Never done  ? Zoster (Shingles) Vaccine (1 of 2) Never done  ? Pap Smear  Never done  ? Colon Cancer Screening  Never done  ? Mammogram  Never done  ? DEXA scan (bone density measurement)  Never done  ? Flu Shot  03/05/2022  ? HPV Vaccine  Aged Out  ? ? ? ?Concerns today: ? ?Patient reports history of uterine cancer (dx in 2019) with hysterectomy, chemo/radiation. Since then she has been struggling with frequent urination. Her provider in [place] had been giving her trospium which was helping some, but she ran out and did not have any more refills since moving here- she is requesitng to try this again. She has had occasional urinary tract infections with this as well. She went to urgent care last week and was treated with antibiotics - she reports that all symptoms have resolved, other than the chronic frequency which is at baseline. Per note, her new gynecologist mentioned trial of vaginal estrogen, but patient states she was unaware of this; she is planning to contact them again and see if she can start. They also placed a urogyn consult, but she could not get an appointment until August.  ? ?Hypothyroidism: Patient reports she has been stable on levothyroxine 75 mcg daily for awhile now. No symptoms. Thyroid labs have not been checked recently.   ? ?Hypertension/Hyperlipidemia: She has been doing well on Lipitor 40 mg daily, clonidine 0.2 mg daily, metoprolol 50 mg BID, and prazosin 5 mg BID. She tries to work on lifestyle factors. She denies any chest pain, palpitations, dyspnea, headaches, vision changes, edema.  ? ? ? ? ? ? ?Patient Active Problem List  ? Diagnosis Date Noted  ? Endometrial cancer ([place]) 08/04/2021  ? History of incarceration 08/04/2021  ? HLD (hyperlipidemia) 08/04/2021  ? HTN (hypertension) 08/04/2021  ? Osteoarthritis 08/04/2021  ? Pain in joint, ankle and foot 11/27/2012  ? Metatarsalgia of both feet 11/27/2012  ? 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 ? ? ?______________________________________________________________________ ?PMH ?Past Medical History:  ?Diagnosis Date  ? ADD (attention deficit disorder)   ? Anemia   ? Arthritis   ? Complication of anesthesia   ? Depression   ? Endometrial cancer ([place])   ? History of incarceration   ? HLD (hyperlipidemia)   ? Hypertension   ? Hypothyroidism   ? Osteoarthritis   ? PONV (postoperative nausea and vomiting)   ? ? ?ROS ?All review of systems negative except what is listed in the HPI ? ?PHYSICAL EXAM ?Physical Exam ?Vitals reviewed.  ?Constitutional:   ?   Appearance: Normal appearance. She is obese.  ?Cardiovascular:  ?   Rate and Rhythm: Normal rate and regular rhythm.  ?Pulmonary:  ?   Effort: Pulmonary effort is normal.  ?   Breath sounds: Normal breath sounds.  ?  Skin: ?   General: Skin is warm and dry.  ?Neurological:  ?   General: No focal deficit present.  ?   Mental Status: She is alert and oriented to person, place, and time.  ?Psychiatric:     ?   Mood and Affect: Mood normal.     ?   Behavior: Behavior normal.     ?   Thought Content: Thought content normal.     ?   Judgment: Judgment normal.  ? ?______________________________________________________________________ ?ASSESSMENT AND PLAN ? ?1. Other depression ?Stable. No new concerns. Denies SI/HI. Refills provided today.  ?-  buPROPion (WELLBUTRIN SR) 150 MG 12 hr tablet; Take 1 tablet (150 mg total) by mouth 2 (two) times daily.  Dispense: 60 tablet; Refill: 3 ?- hydrOXYzine (VISTARIL) 50 MG capsule; Take 1 capsule (50 mg total) by mouth at bedtime.  Dispense: 90 capsule; Refill: 1 ? ?2. Hypertension, unspecified type ?Stable. No new concerns. Refilling medications and updating labs today. Continue heart healthy diet and exercise as tolerated.  ?- CBC ?- Comprehensive metabolic panel ?- Lipid panel ?- TSH ?- cloNIDine (CATAPRES) 0.2 MG tablet; Take 1 tablet (0.2 mg total) by mouth daily.  Dispense: 30 tablet; Refill: 3 ?- metoprolol tartrate (LOPRESSOR) 50 MG tablet; Take 1 tablet (50 mg total) by mouth in the morning and at bedtime.  Dispense: 180 tablet; Refill: 1 ?- prazosin (MINIPRESS) 5 MG capsule; Take 1 capsule (5 mg total) by mouth at bedtime.  Dispense: 90 capsule; Refill: 1 ? ?3. Mixed hyperlipidemia ?Stable. No new concerns. Refilling Lipitor. Continue heart healthy diet and exercise as tolerated. ?- atorvastatin (LIPITOR) 40 MG tablet; Take 1 tablet (40 mg total) by mouth every evening.  Dispense: 90 tablet; Refill: 1 ? ?4. Gastroesophageal reflux disease with esophagitis, unspecified whether hemorrhage ?Stable. No new concerns. Refilling omeprazole.  ?- omeprazole (PRILOSEC) 20 MG capsule; Take 1 capsule (20 mg total) by mouth daily.  Dispense: 30 capsule; Refill: 11 ? ?5. Urinary frequency ?Will trial starting trospium again as this has worked for her in the past. Education provided. Recommend she continue following with GYN and urogynecology. Discussed supportive measures.  ?- trospium (SANCTURA) 20 MG tablet; Take 1 tablet (20 mg total) by mouth 2 (two) times daily.  Dispense: 180 tablet; Refill: 1 ? ?6. Screening for osteoporosis ?7. Postmenopausal ?- DG Bone Density; Future ? ?8. Encounter for screening mammogram for malignant neoplasm of breast ?- MM DIGITAL SCREENING BILATERAL; Future ? ?9. Hypothyroidism,  unspecified type ?Previously stable. Refilling current dose and checking labs today.  ?- TSH ?- levothyroxine (SYNTHROID) 75 MCG tablet; Take 1 tablet (75 mcg total) by mouth daily.  Dispense: 90 tablet; Refill: 1 ? ? ?Establish care  ?Education provided today during visit and on AVS for patient to review at home.  ?Diet and Exercise recommendations provided.  ?Current diagnoses and recommendations discussed. ?HM recommendations reviewed with recommendations.  ? ? ?Outpatient Encounter Medications as of 11/15/2021  ?Medication Sig  ? trospium (SANCTURA) 20 MG tablet Take 1 tablet (20 mg total) by mouth 2 (two) times daily.  ? acetaminophen (TYLENOL) 500 MG tablet Take 1,000 mg by mouth daily as needed.  ? atorvastatin (LIPITOR) 40 MG tablet Take 1 tablet (40 mg total) by mouth every evening.  ? buPROPion (WELLBUTRIN SR) 150 MG 12 hr tablet Take 1 tablet (150 mg total) by mouth 2 (two) times daily.  ? cloNIDine (CATAPRES) 0.2 MG tablet Take 1 tablet (0.2 mg total) by mouth daily.  ? hydrOXYzine (  VISTARIL) 50 MG capsule Take 1 capsule (50 mg total) by mouth at bedtime.  ? levothyroxine (SYNTHROID) 75 MCG tablet Take 1 tablet (75 mcg total) by mouth daily.  ? metoprolol tartrate (LOPRESSOR) 50 MG tablet Take 1 tablet (50 mg total) by mouth in the morning and at bedtime.  ? omeprazole (PRILOSEC) 20 MG capsule Take 1 capsule (20 mg total) by mouth daily.  ? prazosin (MINIPRESS) 5 MG capsule Take 1 capsule (5 mg total) by mouth at bedtime.  ? [DISCONTINUED] ALPRAZolam (XANAX) 1 MG tablet Take 1 mg 3 (three) times daily as needed by mouth for sleep.  ? [DISCONTINUED] atorvastatin (LIPITOR) 40 MG tablet Take 40 mg every evening by mouth.   ? [DISCONTINUED] atorvastatin (LIPITOR) 40 MG tablet Take 1 tablet (40 mg total) by mouth daily.  ? [DISCONTINUED] buPROPion (WELLBUTRIN SR) 150 MG 12 hr tablet Take 1 tablet (150 mg total) by mouth 2 (two) times daily.  ? [DISCONTINUED] buPROPion (WELLBUTRIN XL) 300 MG 24 hr tablet Take  300 mg by mouth daily.  ? [DISCONTINUED] cloNIDine (CATAPRES) 0.2 MG tablet Take 1 tablet (0.2 mg total) by mouth daily.  ? [DISCONTINUED] diclofenac sodium (VOLTAREN) 1 % GEL Apply 1 application 3 (three) times daily

## 2021-11-16 ENCOUNTER — Other Ambulatory Visit (HOSPITAL_COMMUNITY): Payer: Self-pay

## 2021-11-16 LAB — COMPREHENSIVE METABOLIC PANEL
ALT: 22 U/L (ref 0–35)
AST: 17 U/L (ref 0–37)
Albumin: 4.4 g/dL (ref 3.5–5.2)
Alkaline Phosphatase: 178 U/L — ABNORMAL HIGH (ref 39–117)
BUN: 24 mg/dL — ABNORMAL HIGH (ref 6–23)
CO2: 22 mEq/L (ref 19–32)
Calcium: 8.7 mg/dL (ref 8.4–10.5)
Chloride: 106 mEq/L (ref 96–112)
Creatinine, Ser: 1.07 mg/dL (ref 0.40–1.20)
GFR: 54.54 mL/min — ABNORMAL LOW (ref >60.00)
Glucose, Bld: 85 mg/dL (ref 70–99)
Potassium: 3.9 mEq/L (ref 3.5–5.1)
Sodium: 136 mEq/L (ref 135–145)
Total Bilirubin: 0.5 mg/dL (ref 0.2–1.2)
Total Protein: 7 g/dL (ref 6.0–8.3)

## 2021-11-16 LAB — TSH: TSH: 10.08 u[IU]/mL — ABNORMAL HIGH (ref 0.35–5.50)

## 2021-11-16 LAB — CBC
HCT: 37 % (ref 36.0–46.0)
Hemoglobin: 12.5 g/dL (ref 12.0–15.0)
MCHC: 33.9 g/dL (ref 30.0–36.0)
MCV: 82.6 fl (ref 78.0–100.0)
Platelets: 166 10*3/uL (ref 150.0–400.0)
RBC: 4.48 Mil/uL (ref 3.87–5.11)
RDW: 16.4 % — ABNORMAL HIGH (ref 11.5–15.5)
WBC: 7.3 10*3/uL (ref 4.0–10.5)

## 2021-11-16 LAB — LIPID PANEL
Cholesterol: 155 mg/dL (ref 0–200)
HDL: 33.5 mg/dL — ABNORMAL LOW (ref >39.00)
NonHDL: 121.81
Total CHOL/HDL Ratio: 5
Triglycerides: 322 mg/dL — ABNORMAL HIGH (ref 0.0–149.0)
VLDL: 64.4 mg/dL — ABNORMAL HIGH (ref 0.0–40.0)

## 2021-11-16 LAB — LDL CHOLESTEROL, DIRECT: Direct LDL: 82 mg/dL

## 2021-11-16 MED ORDER — ACETAMINOPHEN 500 MG PO TABS
1000.0000 mg | ORAL_TABLET | Freq: Every day | ORAL | 0 refills | Status: DC | PRN
  Filled 2021-11-16: qty 90, 45d supply, fill #0

## 2021-11-16 MED ORDER — LEVOTHYROXINE SODIUM 100 MCG PO TABS: 100.0000 ug | ORAL_TABLET | Freq: Every day | ORAL | 3 refills | Status: DC

## 2021-11-16 NOTE — Addendum Note (Signed)
Addended by: Caleen Jobs B on: 11/16/2021 01:55 PM ? ? Modules accepted: Orders ? ?

## 2021-11-23 ENCOUNTER — Ambulatory Visit: Payer: Medicare Other | Admitting: Family Medicine

## 2021-11-27 ENCOUNTER — Other Ambulatory Visit (HOSPITAL_BASED_OUTPATIENT_CLINIC_OR_DEPARTMENT_OTHER): Payer: Medicare Other

## 2021-11-27 ENCOUNTER — Inpatient Hospital Stay (HOSPITAL_BASED_OUTPATIENT_CLINIC_OR_DEPARTMENT_OTHER): Admission: RE | Admit: 2021-11-27 | Payer: Medicare Other | Source: Ambulatory Visit

## 2021-12-11 ENCOUNTER — Ambulatory Visit (HOSPITAL_BASED_OUTPATIENT_CLINIC_OR_DEPARTMENT_OTHER): Payer: Medicare Other

## 2021-12-11 ENCOUNTER — Other Ambulatory Visit (HOSPITAL_BASED_OUTPATIENT_CLINIC_OR_DEPARTMENT_OTHER): Payer: Medicare Other

## 2021-12-12 ENCOUNTER — Other Ambulatory Visit: Payer: Self-pay | Admitting: Gynecologic Oncology

## 2021-12-12 DIAGNOSIS — E782 Mixed hyperlipidemia: Secondary | ICD-10-CM

## 2021-12-17 ENCOUNTER — Other Ambulatory Visit: Payer: Self-pay | Admitting: Gynecologic Oncology

## 2021-12-17 DIAGNOSIS — E782 Mixed hyperlipidemia: Secondary | ICD-10-CM

## 2021-12-25 ENCOUNTER — Encounter (HOSPITAL_BASED_OUTPATIENT_CLINIC_OR_DEPARTMENT_OTHER): Payer: Self-pay

## 2021-12-25 ENCOUNTER — Other Ambulatory Visit (HOSPITAL_BASED_OUTPATIENT_CLINIC_OR_DEPARTMENT_OTHER): Payer: Medicare Other

## 2021-12-25 ENCOUNTER — Inpatient Hospital Stay (HOSPITAL_BASED_OUTPATIENT_CLINIC_OR_DEPARTMENT_OTHER): Admission: RE | Admit: 2021-12-25 | Payer: Medicare Other | Source: Ambulatory Visit

## 2021-12-26 ENCOUNTER — Telehealth (HOSPITAL_BASED_OUTPATIENT_CLINIC_OR_DEPARTMENT_OTHER): Payer: Self-pay

## 2022-01-04 ENCOUNTER — Other Ambulatory Visit: Payer: Self-pay | Admitting: Family Medicine

## 2022-01-04 DIAGNOSIS — E782 Mixed hyperlipidemia: Secondary | ICD-10-CM

## 2022-01-08 ENCOUNTER — Ambulatory Visit (INDEPENDENT_AMBULATORY_CARE_PROVIDER_SITE_OTHER): Payer: Medicare Other | Admitting: Primary Care

## 2022-01-24 ENCOUNTER — Telehealth: Payer: Self-pay | Admitting: *Deleted

## 2022-01-24 NOTE — Telephone Encounter (Signed)
Left message on voicemail for pt to call and schedule a medicare wellness visit. Pt already has appt with PCP on 7/14 and was going to see if we could get wellness visit scheduled for the same day.

## 2022-01-30 ENCOUNTER — Inpatient Hospital Stay (HOSPITAL_BASED_OUTPATIENT_CLINIC_OR_DEPARTMENT_OTHER): Admission: RE | Admit: 2022-01-30 | Payer: Medicare Other | Source: Ambulatory Visit

## 2022-01-30 ENCOUNTER — Encounter (HOSPITAL_BASED_OUTPATIENT_CLINIC_OR_DEPARTMENT_OTHER): Payer: Self-pay

## 2022-02-01 ENCOUNTER — Ambulatory Visit: Payer: Medicare Other | Admitting: Gynecologic Oncology

## 2022-02-01 DIAGNOSIS — C541 Malignant neoplasm of endometrium: Secondary | ICD-10-CM

## 2022-02-13 ENCOUNTER — Other Ambulatory Visit: Payer: Self-pay | Admitting: Family Medicine

## 2022-02-13 DIAGNOSIS — E782 Mixed hyperlipidemia: Secondary | ICD-10-CM

## 2022-02-14 ENCOUNTER — Inpatient Hospital Stay: Payer: Medicare Other | Attending: Gynecologic Oncology | Admitting: Gynecologic Oncology

## 2022-02-14 DIAGNOSIS — C541 Malignant neoplasm of endometrium: Secondary | ICD-10-CM

## 2022-02-14 NOTE — Patient Instructions (Signed)
It was good to see you today. I don't see or feel any evidence of cancer on your exam.  I will see you in 3 months for follow-up.  As always, if you develop any new and concerning symptoms before your next visit, please call to see me sooner.

## 2022-02-14 NOTE — Progress Notes (Unsigned)
In error - patient did not come to this appt

## 2022-02-15 ENCOUNTER — Ambulatory Visit: Payer: Medicare Other | Admitting: Family Medicine

## 2022-02-15 ENCOUNTER — Telehealth: Payer: Self-pay

## 2022-02-15 NOTE — Telephone Encounter (Signed)
LVM for patient to call back regarding rescheduling her cancelled appointment yesterday 02/14/22

## 2022-02-19 ENCOUNTER — Other Ambulatory Visit: Payer: Self-pay | Admitting: Family Medicine

## 2022-02-19 DIAGNOSIS — F3289 Other specified depressive episodes: Secondary | ICD-10-CM

## 2022-02-19 NOTE — Telephone Encounter (Signed)
Still unable to reach patient to reschedule the  appointment she cancelled with Dr.Tucker from 7/18

## 2022-02-20 NOTE — Telephone Encounter (Signed)
LVM for pt to call so we could get her cancelled appointment rescheduled with Dr.Tucker.   Will send her a MyChart message.

## 2022-02-21 ENCOUNTER — Encounter: Payer: Self-pay | Admitting: Family Medicine

## 2022-02-21 ENCOUNTER — Telehealth (INDEPENDENT_AMBULATORY_CARE_PROVIDER_SITE_OTHER): Payer: Medicare Other | Admitting: Family Medicine

## 2022-02-21 DIAGNOSIS — I10 Essential (primary) hypertension: Secondary | ICD-10-CM | POA: Diagnosis not present

## 2022-02-21 DIAGNOSIS — F32A Depression, unspecified: Secondary | ICD-10-CM | POA: Insufficient documentation

## 2022-02-21 DIAGNOSIS — E039 Hypothyroidism, unspecified: Secondary | ICD-10-CM | POA: Diagnosis not present

## 2022-02-21 DIAGNOSIS — E782 Mixed hyperlipidemia: Secondary | ICD-10-CM | POA: Diagnosis not present

## 2022-02-21 DIAGNOSIS — F3289 Other specified depressive episodes: Secondary | ICD-10-CM | POA: Diagnosis not present

## 2022-02-21 DIAGNOSIS — R350 Frequency of micturition: Secondary | ICD-10-CM

## 2022-02-21 MED ORDER — ATORVASTATIN CALCIUM 40 MG PO TABS: ORAL_TABLET | ORAL | 1 refills | Status: DC

## 2022-02-21 MED ORDER — TROSPIUM CHLORIDE 20 MG PO TABS: 20.0000 mg | ORAL_TABLET | Freq: Two times a day (BID) | ORAL | 1 refills | Status: DC

## 2022-02-21 MED ORDER — PRAZOSIN HCL 5 MG PO CAPS: 5.0000 mg | ORAL_CAPSULE | Freq: Every day | ORAL | 1 refills | Status: DC

## 2022-02-21 MED ORDER — METOPROLOL TARTRATE 50 MG PO TABS: 50.0000 mg | ORAL_TABLET | Freq: Two times a day (BID) | ORAL | 1 refills | Status: DC

## 2022-02-21 MED ORDER — CLONIDINE HCL 0.2 MG PO TABS: 0.2000 mg | ORAL_TABLET | Freq: Every day | ORAL | 3 refills | Status: DC

## 2022-02-21 MED ORDER — BUPROPION HCL ER (SR) 150 MG PO TB12: 150.0000 mg | ORAL_TABLET | Freq: Two times a day (BID) | ORAL | 3 refills | Status: DC

## 2022-02-21 MED ORDER — ACETAMINOPHEN 500 MG PO TABS: 1000.0000 mg | ORAL_TABLET | Freq: Every day | ORAL | 0 refills | Status: DC | PRN

## 2022-02-21 NOTE — Assessment & Plan Note (Signed)
Previously uncontrolled Continue Synthroid at current dose until labs result Recheck TSH and adjust Synthroid as indicated

## 2022-02-21 NOTE — Progress Notes (Signed)
Virtual Video Visit via MyChart Note  I connected with  Emily [name] on 02/21/22 at  1:00 PM EDT by the video enabled telemedicine application for MyChart, and verified that I am speaking with the correct person using two identifiers.   I introduced myself as a Designer, jewellery with the practice. We discussed the limitations of evaluation and management by telemedicine and the availability of in person appointments. The patient expressed understanding and agreed to proceed.  Participating parties in this visit include: The patient and the nurse practitioner listed.  The patient is: At home I am: In the office - [facility]  Subjective:    CC: routine f/u    HPI: Emily [name] is a 66 y.o. year old female presenting today via [facility] today for routine f/u.   HYPERTENSION: - Medications: clonidine 0.2 mg daily, metoprolol tartrate 50 mg BID, prazosin 5 mg BID,  - Compliance: good - Checking BP at home: 120s/80s - Denies any SOB, recurrent headaches, CP, vision changes, LE edema, dizziness, palpitations, or medication side effects. - Diet: regular - Exercise: walking     HYPERLIPIDEMIA - medications: Lipitor 40 mg; Omega-3  - compliance: good - medication SEs: none The 10-year ASCVD risk score (Arnett DK, et al., 2019) is: 7.9%   Values used to calculate the score:     Age: 35 years     Sex: Female     Is Non-Hispanic African American: No     Diabetic: No     Tobacco smoker: No     Systolic Blood Pressure: 242 mmHg     Is BP treated: Yes     HDL Cholesterol: 33.5 mg/dL     Total Cholesterol: 155 mg/dL   Depression/Anxiety: - stable - No SI/HI - Wellbutrin 150 mg BID, hydroxyzine PRN   Urinary frequency: - at baseline, no new concerns - Trospium 20 mg BID - she sees urogyn next month   Hypothyroidism: - levothyroxine 100 mcg daily - In April, TSH was 10.08 and dose adjusted. She was supposed to recheck in 6 weeks, but did  not. Will order today. - She denies any fatigue, cold sensitivity, changes to hair/skin/nails, cardiac symptoms   Alk phos was also slightly elevated last time. Will order repeat fasting labs.        Past medical history, Surgical history, Family history not pertinant except as noted below, Social history, Allergies, and medications have been entered into the medical record, reviewed, and corrections made.   Review of Systems:  All review of systems negative except what is listed in the HPI   Objective:    General:  Speaking clearly in complete sentences. Absent shortness of breath noted.   Alert and oriented x3.   Normal judgment.  Absent acute distress.   Impression and Recommendations:    Problem List Items Addressed This Visit       Cardiovascular and Mediastinum   HTN (hypertension)    Blood pressure is at goal for age and co-morbidities (based on home readings).  I recommend continuing current regimen.   - BP goal <130/80 - monitor and log blood pressures at home - check around the same time each day in a relaxed setting - Limit salt to <2000 mg/day - Follow DASH eating plan (heart healthy diet) - limit alcohol to 2 standard drinks per day for men and 1 per day for women - avoid tobacco products - get at least 2 hours of regular aerobic  exercise weekly Patient aware of signs/symptoms requiring further/urgent evaluation. Labs ordered.       Relevant Medications   atorvastatin (LIPITOR) 40 MG tablet   cloNIDine (CATAPRES) 0.2 MG tablet   metoprolol tartrate (LOPRESSOR) 50 MG tablet   prazosin (MINIPRESS) 5 MG capsule   Other Relevant Orders   CBC   Comprehensive metabolic panel   Lipid panel   TSH     Endocrine   Hypothyroidism - Primary    Previously uncontrolled Continue Synthroid at current dose until labs result Recheck TSH and adjust Synthroid as indicated       Relevant Medications   metoprolol tartrate (LOPRESSOR) 50 MG tablet   Other  Relevant Orders   TSH     Other   HLD (hyperlipidemia)    -Reviewed most recent lipid panel -Medication management: continue lipitor -Repeat CMP and lipid panel ordered -Diet low in saturated fat -Regular exercise - at least 30 minutes, 5 times per week       Relevant Medications   atorvastatin (LIPITOR) 40 MG tablet   cloNIDine (CATAPRES) 0.2 MG tablet   metoprolol tartrate (LOPRESSOR) 50 MG tablet   prazosin (MINIPRESS) 5 MG capsule   Other Relevant Orders   Comprehensive metabolic panel   Lipid panel   Depression    Stable. No SI/HI. Continue Wellbutrin and PRN hydroxyzine.      Relevant Medications   buPROPion (WELLBUTRIN SR) 150 MG 12 hr tablet   Urinary frequency    Doing well on trospium. Keep upcoming appointment with uro-gynecology.      Relevant Medications   trospium (SANCTURA) 20 MG tablet       Follow-up if symptoms worsen or fail to improve.    I discussed the assessment and treatment plan with the patient. The patient was provided an opportunity to ask questions and all were answered. The patient agreed with the plan and demonstrated an understanding of the instructions.   The patient was advised to call back or seek an in-person evaluation if the symptoms worsen or if the condition fails to improve as anticipated.   Schedule lab appointment as soon as possible. Routine f/u in 3-4 months.   Terrilyn Saver, NP

## 2022-02-21 NOTE — Assessment & Plan Note (Signed)
-  Reviewed most recent lipid panel -Medication management: continue lipitor -Repeat CMP and lipid panel ordered -Diet low in saturated fat -Regular exercise - at least 30 minutes, 5 times per week

## 2022-02-21 NOTE — Assessment & Plan Note (Signed)
Stable. No SI/HI. Continue Wellbutrin and PRN hydroxyzine.

## 2022-02-21 NOTE — Assessment & Plan Note (Signed)
Doing well on trospium. Keep upcoming appointment with uro-gynecology.

## 2022-02-21 NOTE — Assessment & Plan Note (Signed)
Blood pressure is at goal for age and co-morbidities (based on home readings).  I recommend continuing current regimen.   - BP goal <130/80 - monitor and log blood pressures at home - check around the same time each day in a relaxed setting - Limit salt to <2000 mg/day - Follow DASH eating plan (heart healthy diet) - limit alcohol to 2 standard drinks per day for men and 1 per day for women - avoid tobacco products - get at least 2 hours of regular aerobic exercise weekly Patient aware of signs/symptoms requiring further/urgent evaluation. Labs ordered.

## 2022-03-08 ENCOUNTER — Ambulatory Visit: Payer: Medicare Other | Admitting: Obstetrics and Gynecology

## 2022-03-21 ENCOUNTER — Encounter: Payer: Self-pay | Admitting: Gynecologic Oncology

## 2022-03-22 ENCOUNTER — Other Ambulatory Visit: Payer: Self-pay

## 2022-03-22 ENCOUNTER — Encounter: Payer: Self-pay | Admitting: Gynecologic Oncology

## 2022-03-22 ENCOUNTER — Inpatient Hospital Stay: Payer: Medicare Other | Attending: Gynecologic Oncology | Admitting: Gynecologic Oncology

## 2022-03-22 VITALS — BP 108/70 | HR 88 | Temp 97.8°F | Resp 20 | Ht 65.0 in | Wt 212.0 lb

## 2022-03-22 DIAGNOSIS — R350 Frequency of micturition: Secondary | ICD-10-CM | POA: Insufficient documentation

## 2022-03-22 DIAGNOSIS — Z90710 Acquired absence of both cervix and uterus: Secondary | ICD-10-CM | POA: Insufficient documentation

## 2022-03-22 DIAGNOSIS — Z8542 Personal history of malignant neoplasm of other parts of uterus: Secondary | ICD-10-CM | POA: Diagnosis present

## 2022-03-22 DIAGNOSIS — N898 Other specified noninflammatory disorders of vagina: Secondary | ICD-10-CM | POA: Diagnosis not present

## 2022-03-22 DIAGNOSIS — Z90722 Acquired absence of ovaries, bilateral: Secondary | ICD-10-CM | POA: Diagnosis not present

## 2022-03-22 DIAGNOSIS — Z923 Personal history of irradiation: Secondary | ICD-10-CM | POA: Insufficient documentation

## 2022-03-22 DIAGNOSIS — Z9221 Personal history of antineoplastic chemotherapy: Secondary | ICD-10-CM | POA: Diagnosis not present

## 2022-03-22 DIAGNOSIS — C541 Malignant neoplasm of endometrium: Secondary | ICD-10-CM

## 2022-03-22 NOTE — Progress Notes (Signed)
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 Cervical stromal involvement and positive LVSI. ER/PR negative. HER2 positive. S/p definitive surgery 05/14/19. Adjuvant treatment: 6C carbo/taxol, EBRT with brachy boost. Adjuvant treatment completed 01/2020.  Early history: Pap smear completed in August 2019 which returned as atypical endometrial cells. In clinic sampling initially failed at the [facility] due to cervical stenosis. She was recommended to undergo hysteroscopy, D&C, ECC for sampling but surgery was cancelled twice. She subsequently underwent EmBx and ECC which showed on outside pathology FIGO grade 2 endometrial cancer. On [facility] review of path she was noted to have adenocarcinoma with gastrointestinal differentiation. The patient was referred to [place] at [facility] She was initially seen in April 2020 at which time she declined surgical intervention. She subsequently had an IUD placed in May 2020 as she desired to delay surgery further in the setting of COVID. Given the [facility] pathology read of gastrointestinal differentiation, the patient was recommended to undergo a colonoscopy which she declined. Patient was subsequently scheduled for surgery in August 2020, but surgery was cancelled secondary to challenges with proper isolation following COVID screening via the [facility].  Ms. Emily [name] had a pap smear completed in August 2019 which returned as atypical endometrial cells. In clinic sampling initially failed at the [facility] due to cervical stenosis. She was recommended to undergo hysteroscopy, D&C, ECC for sampling but surgery was cancelled twice. She subsequently underwent EmBx and ECC which showed on outside pathology FIGO grade 2 endometrial cancer. On [facility] review of path she was noted to have  adenocarcinoma with gastrointestinal differentiation. The patient was referred to [place] at [facility]. She was initially seen in April 2020 at which time she declined surgical intervention. She subsequently had an IUD placed in May 2020 as she desired to delay surgery further in the setting of COVID. Given the [facility] pathology read of gastrointestinal differentiation, the patient was recommended to undergo a colonoscopy which she declined. Patient was subsequently scheduled for surgery in August 2020, but surgery was cancelled secondary to challenges with proper isolation following COVID screening via the [facility].     Endometrial cancer ([place])  04/2018 Initial Diagnosis   Endometrial cancer ([place])   01/27/2019 Imaging   CT A/P: FINDINGS:  PELVIC/REPRODUCTIVE ORGANS: Intrauterine device is appropriately positioned within the uterine cavity. 1.2 cm intramural hyperattenuating ovoid mass in the right-sided myometrium of the lower uterine segment.  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 --Stable non-calcified nodule in the inferior lingula with atelectasis favored. Attention on followup imaging. --No evidence of metastatic disease in the abdomen or pelvis   05/14/2019 Surgery   Robotic-assisted total laparoscopic hysterectomy, bilateral salpingo-oophorectomy, left pelvic lymphadenectomy with right sentinel lymph node dissection  Operative Findings: Small anteverted uterus with 6cm posterior fibroid. IUD strings visible in the cervix; removed. Normal bilateral fallopian tubes. Left ovary with 2cm physiologic cyst; normal right ovary. Upper abdominal survey with filmy adhesions occluding view of right lobe of liver and diaphragm. Omentum adherent to anterior abdominal wall at the umbilicus. Normal appearing colon. No mapping of sentinel lymph node on left. Sentinel lymph node on right identified as right internal  iliac artery.     05/14/2019 Pathology Results   Final Diagnosis  A: Lymph node, left pelvic, lymphadenectomy -  Six lymph nodes with no evidence of malignancy (0/6) B: Lymph node, right internal iliac, sentinel lymph node excision - One lymph node with metastatic carcinoma (1/1) C: Uterus with cervix and bilateral tubes and ovaries, hysterectomy with bilateral salpingo-oophorectomy - High grade adenocarcinoma with gastrointestinal differentiation (see diagnostic comment) - FIGO Stage IIIC1 Additional findings: - Adenomyosis/Adenomyoma  - Leiomyoma with degenerative changes - Bilateral ovaries and fallopian tubes with no pathologic diagnosis     05/14/2019 Pathologic Stage   Stage: 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 Cervical stromal involvement and positive LVSI. ER/PR negative. HER2 positive. Plan: Adjuvant platinum/taxane combination chemotherapy with EBRT.    06/22/2019 - 10/19/2019 Chemotherapy   Carboplatin (AUC 6) and taxol (117m/m2)   11/08/2019 Imaging   Post chemotherapy treatment CT C/A/P IMPRESSION: --No evidence of thoracic metastasis. -- No evidence of metastatic disease within the abdomen or pelvis. -- Large fluid collection along left pelvic sidewall, likely lymphocele status post lymphadenectomy. -- Splenomegaly. -- Mild perivesicular stranding and anterior lateral thickening, possibly reactive versus inflammatory. Recommend correlation with urinalysis.    12/2019 - 01/19/2020 Radiation Therapy   Treatment Site: 180cGy x 28 = 5040cGy to the pelvis, IMRT, followed by 3 fractions of vaginal cylinder brachytherapy boost for an additional 1500cGy completed 01/19/20   09/18/2020 Imaging   CT C/A/P: -No evidence of metastatic disease within the abdomen or pelvis. -Redemonstrated simple fluid collection along the left pelvic sidewall favored to reflect a postoperative lymphocele. - Interval decrease in size of previously noted  left lower lobe nodule with calcifications. No new or enlarging pulmonary nodule.     02/16/2021 Imaging   CT C/AP: --No evidence of metastatic disease in the abdomen or pelvis.  --Similar 8.0 cm low-attenuation left pelvic sidewall cystic lesion, likely a postoperative lymphocele.     Interval History: Doing well.  Denies any abdominal or pelvic pain.  Denies any vaginal bleeding or discharge.  Reports normal bowel function.  Continues to have urinary frequency, unchanged since her last visit with me.  Was scheduled to see Dr. SWannetta Sender had to cancel this visit recently as she had COVID within the last month.  Has now established with a nurse practitioner as her PCP.  Past Medical/Surgical History: Past Medical History:  Diagnosis Date   ADD (attention deficit disorder)    Anemia    Arthritis    Complication of anesthesia    Depression    [place]ometrial [place] ([place]    History of incarceration    HLD (hyperlipidemia)    Hypertension    Hypothyroidism    Osteoarthritis    PONV (postoperative nausea and vomiting)     Past Surgical History:  Procedure Laterality Date   ABDOMINAL HYSTERECTOMY     CHOLECYSTECTOMY     CHOLECYSTECTOMY, LAPAROSCOPIC  2009   DILATION AND CURETTAGE OF UTERUS     "several" per pt   IR IMAGING GUIDED PORT INSERTION  06/14/2019   NASAL SEPTUM SURGERY     ROBOTIC ASSISTED TOTAL HYSTERECTOMY WITH BILATERAL SALPINGO OOPHERECTOMY  05/14/2019   with Left pelvic lymphadenectomy and right sentinel lymph node dissection by Dr. GCleopatra Cedar  TUBAL LIGATION      Family History  Problem Relation Age of Onset   Breast cancer Mother    Lung cancer Father    Heart murmur Father    Obesity Sister    Stroke Sister    Colon cancer Neg Hx    Ovarian cancer Neg Hx    Endometrial  cancer Neg Hx    Pancreatic cancer Neg Hx    Prostate cancer Neg Hx     Social History   Socioeconomic History   Marital status: Single    Spouse name: Not on file   Number of  children: Not on file   Years of education: Not on file   Highest education level: Not on file  Occupational History   Not on file  Tobacco Use   Smoking status: Never   Smokeless tobacco: Never  Vaping Use   Vaping Use: Never used  Substance and Sexual Activity   Alcohol use: Not Currently   Drug use: Never   Sexual activity: Not Currently    Birth control/protection: None  Other Topics Concern   Not on file  Social History Narrative   ** Merged History Encounter **       ** Merged History Encounter **       Social Determinants of Health   Financial Resource Strain: Not on file  Food Insecurity: Not on file  Transportation Needs: Not on file  Physical Activity: Not on file  Stress: Not on file  Social Connections: Not on file    Current Medications:  Current Outpatient Medications:    acetaminophen (TYLENOL) 500 MG tablet, Take 2 tablets (1,000 mg total) by mouth daily as needed., Disp: 90 tablet, Rfl: 0   atorvastatin (LIPITOR) 40 MG tablet, TAKE ONE (1) TABLET BY MOUTH EVERY DAY, Disp: 90 tablet, Rfl: 1   buPROPion (WELLBUTRIN SR) 150 MG 12 hr tablet, Take 1 tablet (150 mg total) by mouth 2 (two) times daily., Disp: 60 tablet, Rfl: 3   cloNIDine (CATAPRES) 0.2 MG tablet, Take 1 tablet (0.2 mg total) by mouth daily., Disp: 30 tablet, Rfl: 3   hydrOXYzine (VISTARIL) 50 MG capsule, TAKE ONE CAPSULE AT BEDTIME, Disp: 90 capsule, Rfl: 1   levothyroxine (SYNTHROID) 100 MCG tablet, Take 1 tablet (100 mcg total) by mouth daily., Disp: 90 tablet, Rfl: 3   metoprolol tartrate (LOPRESSOR) 50 MG tablet, Take 1 tablet (50 mg total) by mouth in the morning and at bedtime., Disp: 180 tablet, Rfl: 1   omeprazole (PRILOSEC) 20 MG capsule, Take 1 capsule (20 mg total) by mouth daily., Disp: 30 capsule, Rfl: 11   prazosin (MINIPRESS) 5 MG capsule, Take 1 capsule (5 mg total) by mouth at bedtime., Disp: 90 capsule, Rfl: 1   trospium (SANCTURA) 20 MG tablet, Take 1 tablet (20 mg total) by  mouth 2 (two) times daily., Disp: 180 tablet, Rfl: 1  Review of Systems: + Urinary frequency Denies appetite changes, fevers, chills, fatigue, unexplained weight changes. Denies hearing loss, neck lumps or masses, mouth sores, ringing in ears or voice changes. Denies cough or wheezing.  Denies shortness of breath. Denies chest pain or palpitations. Denies leg swelling. Denies abdominal distention, pain, blood in stools, constipation, diarrhea, nausea, vomiting, or early satiety. Denies pain with intercourse, dysuria, hematuria or incontinence. Denies hot flashes, pelvic pain, vaginal bleeding or vaginal discharge.   Denies joint pain, back pain or muscle pain/cramps. Denies itching, rash, or wounds. Denies dizziness, headaches, numbness or seizures. Denies swollen lymph nodes or glands, denies easy bruising or bleeding. Denies anxiety, depression, confusion, or decreased concentration.  Physical Exam: BP 108/70 (BP Location: Right Arm, Patient Position: Sitting)   Pulse 88   Temp 97.8 F (36.6 C) (Oral)   Resp 20   Ht 5' 5"  (1.651 m)   Wt 212 lb (96.2 kg)   BMI 35.28  kg/m  General: Alert, oriented, no acute distress. HEENT: Normocephalic, atraumatic, sclera anicteric. Chest: Unlabored breathing on room air. Abdomen: Obese, soft, nontender.  Normoactive bowel sounds.  No masses or hepatosplenomegaly appreciated.  Well-healed incisions. Extremities: Grossly normal range of motion.  Warm, well perfused.  No edema bilaterally. Skin: No rashes or lesions noted. Lymphatics: No cervical, supraclavicular, or inguinal adenopathy. GU: Normal appearing external genitalia without erythema, excoriation, or lesions.  Speculum exam reveals moderately atrophic vaginal mucosa, vagina somewhat foreshortened.  No lesions or masses noted.  Bimanual exam reveals cuff intact, no masses or nodularity.  Rectovaginal exam confirms these findings.  Laboratory & Radiologic Studies: None new  Assessment &  Plan: Emily [name] is a 66 y.o. woman with history of advanced stage high-risk endometrial cancer.  Finished adjuvant treatment in 01/2020 (C/T, EBRT and VBT).  HER2+.   Patient is doing well and is NED on exam today.   Given urinary frequency, I suspect that she may have some chronic changes related to radiation.  I do not think that postoperative fluid collection explains her urinary symptoms.  Referral was previously sent to Dr. Wannetta Sender.  She had to miss this appointment recently secondary to having COVID.  I have encouraged her to call to get this appointment rescheduled.   Per NCCN surveillance recommendations, will continue with planned surveillance visits every 3 months until 2-3 years after completion of treatment and then transition to visits every 6 months.  At this time, she would like to continue with visits every 3 months.  Discussed signs and symptoms that could represent cancer recurrence and urged her to call to see me sooner if she develops any.    Has some vaginal shortening likely related to RT after surgery and no dilator use.   After the patient left clinic, I realized that she has not had a port flush in over 3 months.  My office staff reached out to her (caught her waiting for her car at [place]); she did not want to come back for a port flush today as she has to go to work.  We will reach out to her next week regarding coming in for a port flush versus having her port taken out.   26 minutes of total time was spent for this patient encounter, including preparation, face-to-face counseling with the patient and coordination of care, and documentation of the encounter.  Jeral Pinch, MD  Division of Gynecologic Oncology  Department of Obstetrics and Gynecology  [facility]

## 2022-03-22 NOTE — Patient Instructions (Signed)
It was good to see you today.  I do not see or feel any evidence of cancer on your exam.  At this time, we will continue with follow-up visits every 3 months.  Please call if you develop any new and concerning symptoms before your next visit.  Please call to get rescheduled to see Dr. Wannetta Sender about your urinary symptoms.

## 2022-03-25 ENCOUNTER — Telehealth: Payer: Self-pay

## 2022-03-25 NOTE — Telephone Encounter (Signed)
LM for Emily [name]  that Dr. Berline Lopes wanted her staff to follow up with her to see if she wants to keep the port-a-cath in or have it removed at this time. If she wants to keep it in, she needs to call us and schedule a flush appointment as the flush is over due. It has been over 3 months since the last flush.  It needs to be flushed every 6-8 weeks to help prevent blood clots. If she wants it removed, our office can get that scheduled with Interventional radiology asap.  Thursdays are better for Emily [name] for scheduling purposes.

## 2022-03-26 NOTE — Telephone Encounter (Signed)
Pt returning call from yesterday. She states she prefers to keep the port-a-cath. She is aware it needs to be flushed every 6-8 weeks. She has asked if we can schedule to have it flushed on a Wednesday after 10:00.  Pt aware I will schedule this and call her back.

## 2022-03-26 NOTE — Telephone Encounter (Signed)
I spoke to patient she is aware of the port flush appointment being scheduled for tomorrow 8/23. She states she can not do that date but she can do next Wednesday.  Appointment is scheduled for 8/30 @ 11:45am.  Pt agreed to date and time.

## 2022-04-02 ENCOUNTER — Other Ambulatory Visit: Payer: Self-pay | Admitting: Family Medicine

## 2022-04-02 DIAGNOSIS — I10 Essential (primary) hypertension: Secondary | ICD-10-CM

## 2022-04-03 ENCOUNTER — Inpatient Hospital Stay: Payer: Medicare Other

## 2022-04-24 ENCOUNTER — Ambulatory Visit: Payer: Medicare Other | Admitting: Obstetrics and Gynecology

## 2022-06-25 ENCOUNTER — Inpatient Hospital Stay: Payer: Medicare Other | Admitting: Gynecologic Oncology

## 2022-07-02 ENCOUNTER — Ambulatory Visit: Payer: Medicare Other | Admitting: Family Medicine

## 2022-07-21 ENCOUNTER — Telehealth: Payer: Medicare Other | Admitting: Physician Assistant

## 2022-07-21 DIAGNOSIS — M549 Dorsalgia, unspecified: Secondary | ICD-10-CM | POA: Diagnosis not present

## 2022-07-22 MED ORDER — BACLOFEN 10 MG PO TABS: 10.0000 mg | ORAL_TABLET | Freq: Three times a day (TID) | ORAL | 0 refills | Status: DC

## 2022-07-22 MED ORDER — NAPROXEN 500 MG PO TABS: 500.0000 mg | ORAL_TABLET | Freq: Two times a day (BID) | ORAL | 0 refills | Status: DC

## 2022-07-22 NOTE — Progress Notes (Signed)

## 2022-07-30 ENCOUNTER — Encounter: Payer: Self-pay | Admitting: Gynecologic Oncology

## 2022-07-30 ENCOUNTER — Inpatient Hospital Stay: Payer: Medicare Other | Admitting: Gynecologic Oncology

## 2022-07-30 ENCOUNTER — Telehealth: Payer: Self-pay | Admitting: *Deleted

## 2022-07-30 DIAGNOSIS — C541 Malignant neoplasm of endometrium: Secondary | ICD-10-CM

## 2022-07-30 IMAGING — CT CT ABD-PELV W/ CM
2 of 5 series · 16 of 46 positions shown, 18 images · IV contrast (OMNIPAQUE)
Comparison: None.

CLINICAL DATA: Endometrial cancer, treatment complete 4047, no new
symptoms, surveillance * Tracking Code: BO *

EXAM:
CT ABDOMEN AND PELVIS WITH CONTRAST
TECHNIQUE: Multidetector CT imaging of the abdomen and pelvis was performed
using the standard protocol following bolus administration of
intravenous contrast.

[Series 2: axial st · axial · 0.93mm/px · z∈[-489,-99]mm · 13 of 91 slices shown, 15 images]
[im 7/91  soft-tissue]
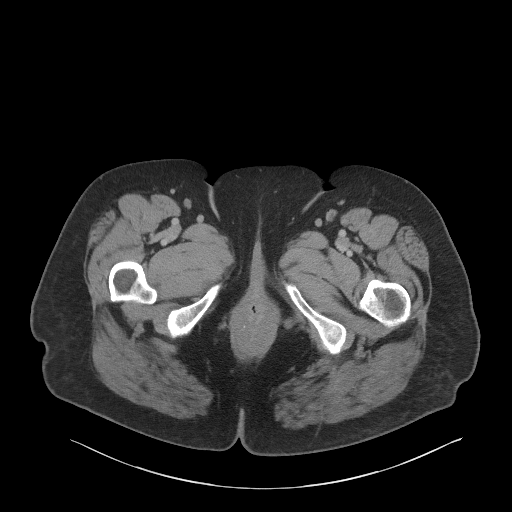
[im 7/91  bone]
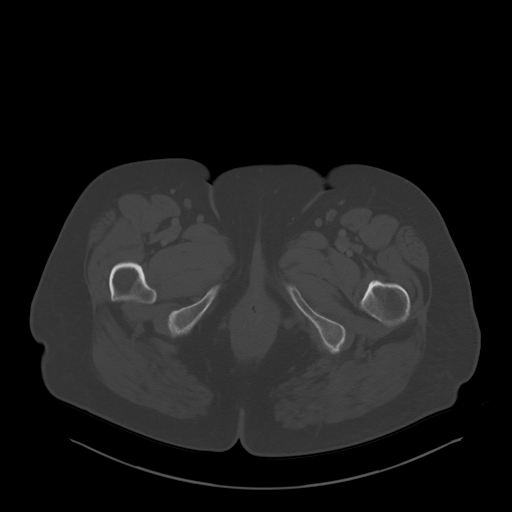
[im 13/91  soft-tissue]
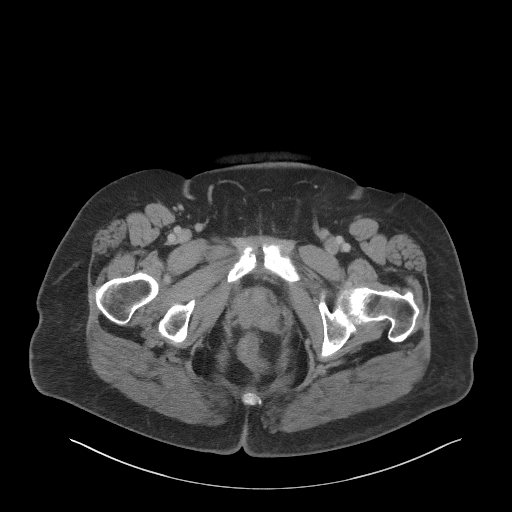
[im 19/91  soft-tissue]
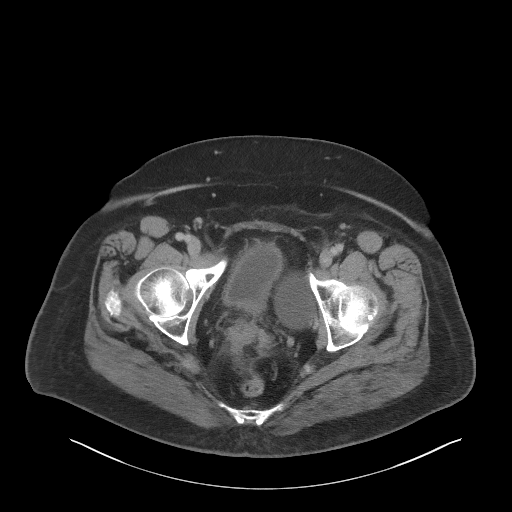
[im 25/91  soft-tissue]
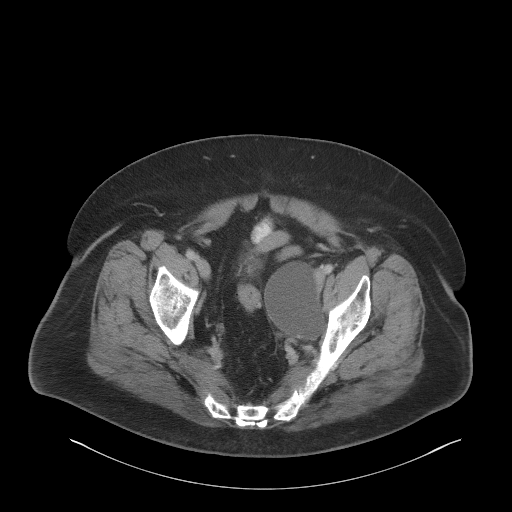
[im 31/91  soft-tissue]
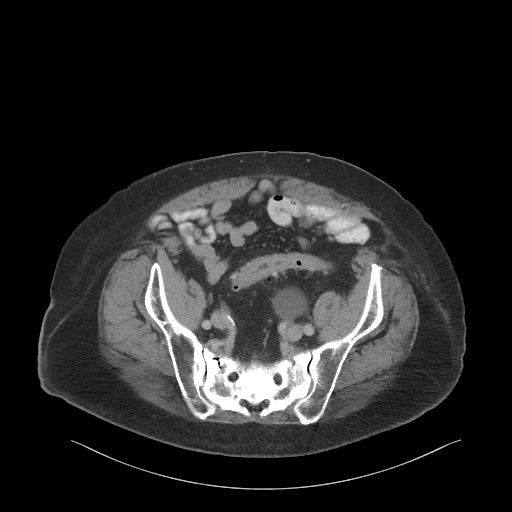
[im 37/91  soft-tissue]
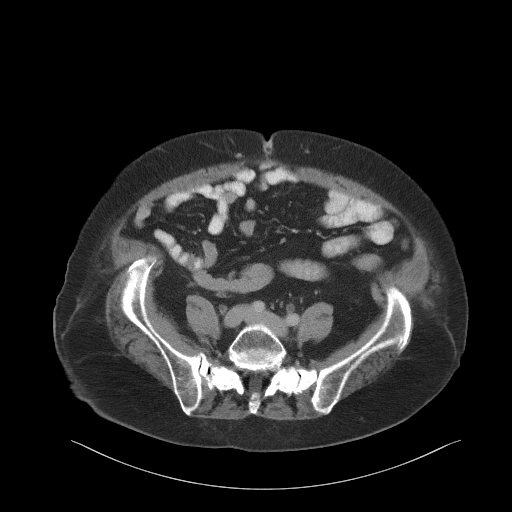
[im 49/91  soft-tissue]
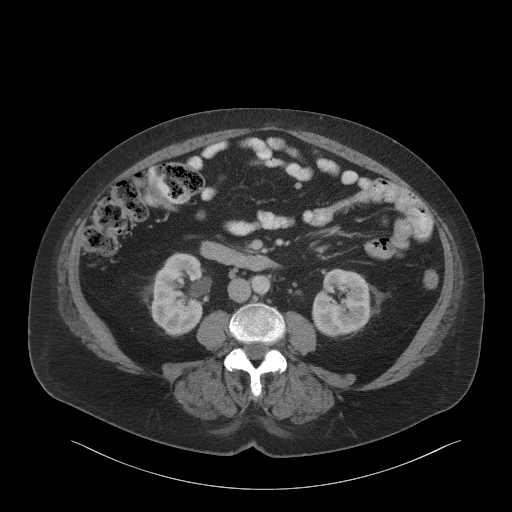
[im 55/91  soft-tissue]
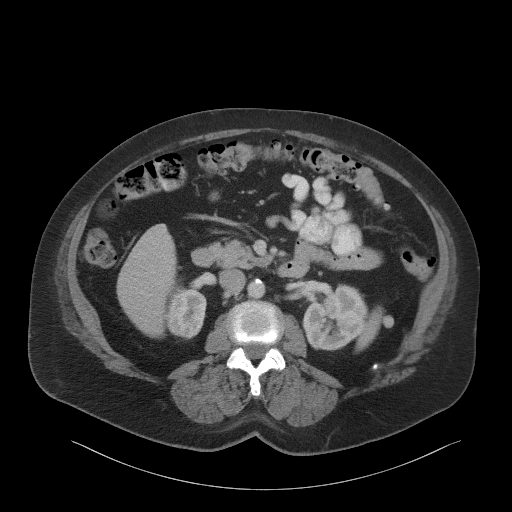
[im 61/91  soft-tissue]
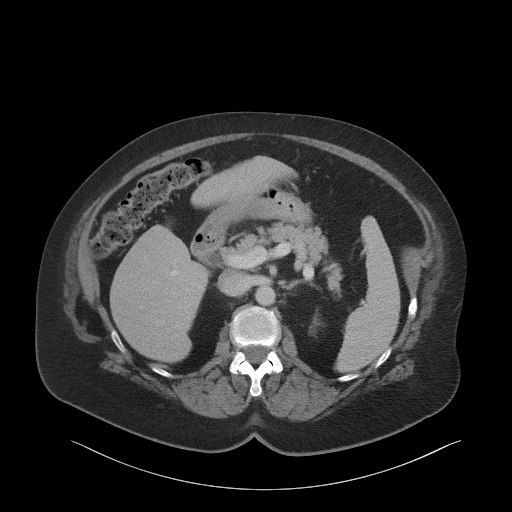
[im 61/91  bone]
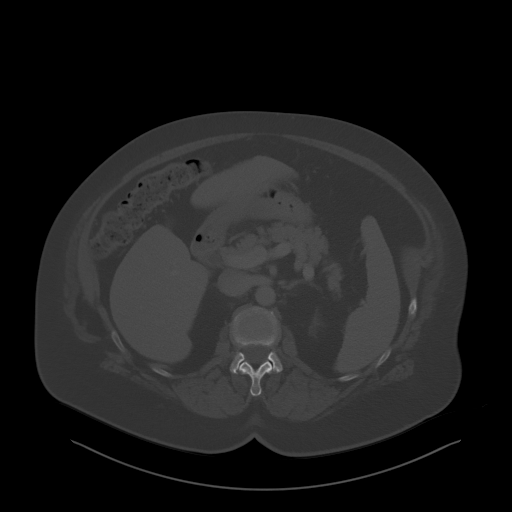
[im 67/91  soft-tissue]
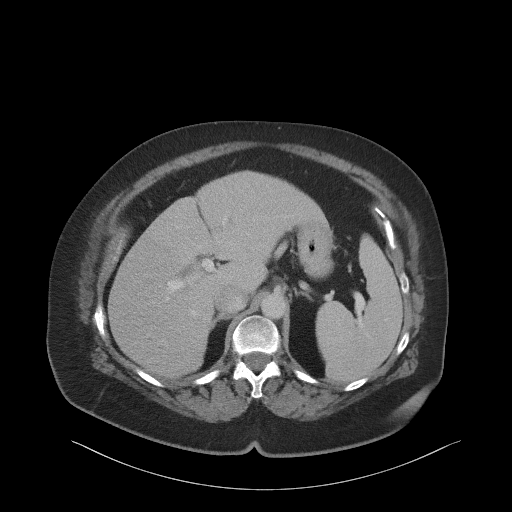
[im 73/91  soft-tissue]
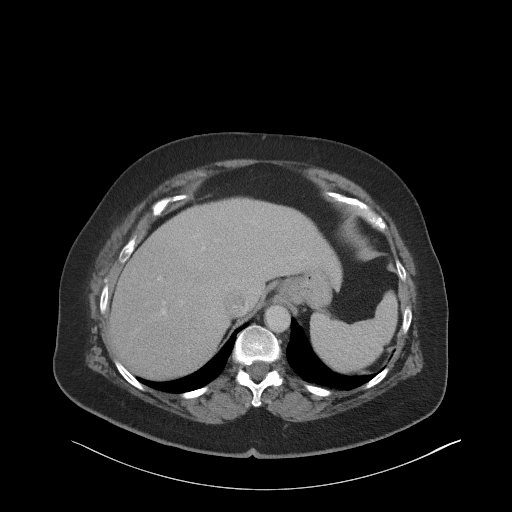
[im 79/91  soft-tissue]
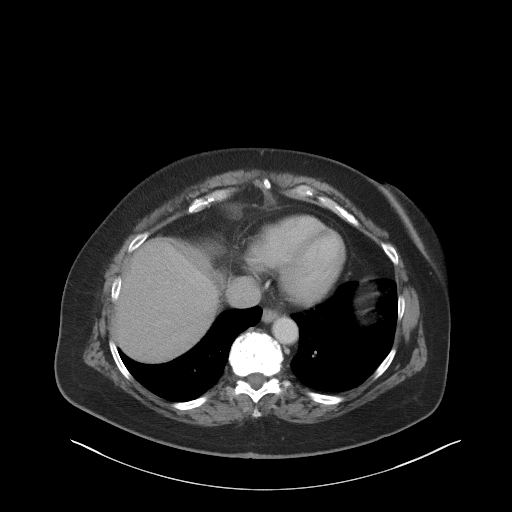
[im 85/91  soft-tissue]
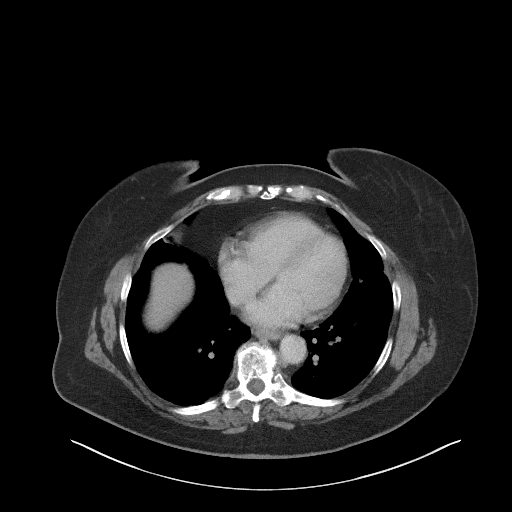

[Series 6: coronal st · coronal · 0.82mm/px · 3 of 107 slices shown]
[im 36/107  soft-tissue]
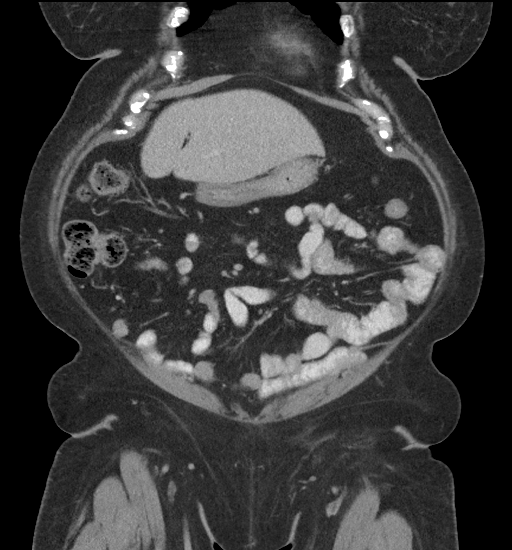
[im 48/107  soft-tissue]
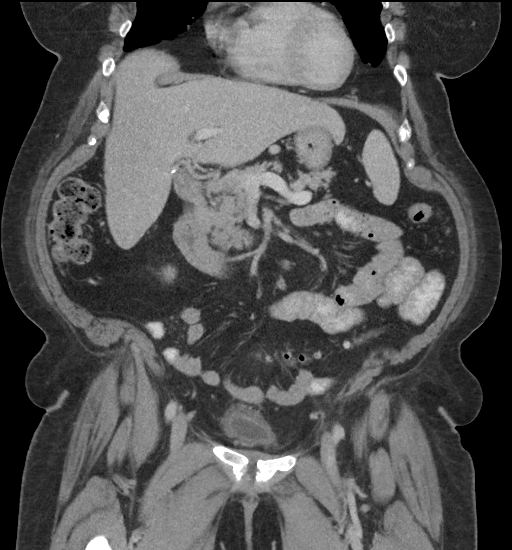
[im 59/107  soft-tissue]
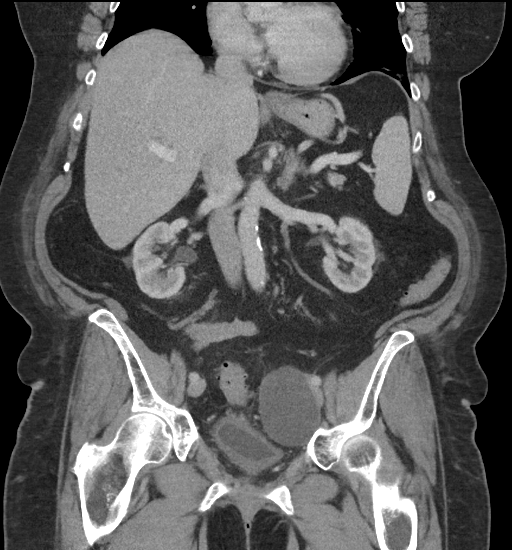

[16 of 46 positions shown; findings below may reference images not displayed]

RADIATION DOSE REDUCTION: This exam was performed according to the
departmental dose-optimization program which includes automated
exposure control, adjustment of the mA and/or kV according to
patient size and/or use of iterative reconstruction technique.

CONTRAST:  100mL OMNIPAQUE IOHEXOL 300 MG/ML  SOLN
FINDINGS: Lower chest: No acute abnormality. Scarring and or atelectasis of
the bilateral lung bases.

Hepatobiliary: No focal liver abnormality is seen. Status post
cholecystectomy. Postoperative biliary dilatation.

Pancreas: Unremarkable. No pancreatic ductal dilatation or
surrounding inflammatory changes.

Spleen: Normal in size without significant abnormality.

Adrenals/Urinary Tract: Adrenal glands are unremarkable. Kidneys are
normal, without renal calculi, solid lesion, or hydronephrosis.
Thickening and mucosal hyperenhancement of the urinary bladder wall
(series 7, image 68).

Stomach/Bowel: Stomach is within normal limits. Appendix is
diminutive or surgically absent. No evidence of bowel wall
thickening, distention, or inflammatory changes. Descending and
sigmoid diverticulosis.

Vascular/Lymphatic: Aortic atherosclerosis. No enlarged abdominal or
pelvic lymph nodes.

Reproductive: Status post hysterectomy. Cystic lesion overlying the
left pelvic sidewall, in the generally expected vicinity of the left
ovary measuring 7.6 x 5.3 cm (series 2, image 69).

Other: No abdominal wall hernia or abnormality. No ascites.

Musculoskeletal: No acute osseous findings. Sclerotic fractures of
the left and right sacral ala (series 2, image 61, series 6, image
87). Superior endplate deformity of L5.
IMPRESSION: 1. Status post hysterectomy. No evidence of lymphadenopathy or
metastatic disease in the abdomen or pelvis.
2. Cystic lesion overlying the left pelvic sidewall, in the
generally expected vicinity of the left ovary measuring 7.6 x
cm. The ovaries appear to be surgically absent and this is
presumably a postoperative seroma or lymphocele. Correlate with
prior surveillance imaging to assess for stability, attention on
follow-up.
3. Thickening and mucosal hyperenhancement of the urinary bladder
wall, consistent with nonspecific infectious or inflammatory
cystitis, possibly radiation cystitis. Correlate with urinalysis.
4. Sclerotic insufficiency fractures of the left and right sacral
ala as well as a superior endplate deformity of L5, presumably
related to local radiation therapy.

Aortic Atherosclerosis (FDULS-OCP.P).

## 2022-07-30 NOTE — Progress Notes (Unsigned)
In error

## 2022-07-30 NOTE — Telephone Encounter (Signed)
Per patient my chart message appt for today canceled

## 2022-09-03 ENCOUNTER — Other Ambulatory Visit: Payer: Self-pay | Admitting: Family Medicine

## 2022-09-03 DIAGNOSIS — I10 Essential (primary) hypertension: Secondary | ICD-10-CM

## 2022-09-09 ENCOUNTER — Other Ambulatory Visit: Payer: Self-pay | Admitting: Family Medicine

## 2022-09-09 DIAGNOSIS — E039 Hypothyroidism, unspecified: Secondary | ICD-10-CM

## 2022-10-15 ENCOUNTER — Encounter: Payer: Medicare Other | Admitting: Nurse Practitioner

## 2022-10-15 ENCOUNTER — Other Ambulatory Visit: Payer: Self-pay | Admitting: Family Medicine

## 2022-10-15 DIAGNOSIS — E782 Mixed hyperlipidemia: Secondary | ICD-10-CM

## 2022-10-15 NOTE — Progress Notes (Signed)
Because of your medical history and increased risk for a complicated UTI, I feel your condition warrants further evaluation and I recommend that you be seen in a face to face visit.   NOTE: There will be NO CHARGE for this eVisit   If you are having a true medical emergency please call 911.      For an urgent face to face visit, [facility] has eight urgent care centers for your convenience:   NEW!! [place] [facility] [place] [facility] [place] Get Driving Directions [id number] [address] [place], [place] [facility] [place] at [place] Get Driving Directions [id number] [place], [place] [address]   [place] [facility] [place] [facility]) Get Driving Directions [id number] [address], [place] [address]  [place] [facility] [place] ([place]) Get Driving Directions [id number] [address] [place],  [place]  [address]  [place] [facility] [place] [facility] at [address] Get Driving Directions  [phone] [address] [place],  [place] [address]   [place] [facility] at [facility] Get Driving Directions [id number] [place], [place], [place] [address]   [place] [facility] Get Driving Directions  [id number] [address] [place], [place] [address]   [place] [facility] [place] Get Driving Directions [id number] [address], [place], [place] [address]  Your MyChart E-visit questionnaire answers were reviewed by a board certified advanced clinical practitioner to complete your personal care plan based on your specific symptoms.  Thank you for using e-Visits.

## 2022-10-22 ENCOUNTER — Ambulatory Visit (INDEPENDENT_AMBULATORY_CARE_PROVIDER_SITE_OTHER): Payer: Medicare Other | Admitting: Family Medicine

## 2022-10-22 ENCOUNTER — Encounter: Payer: Self-pay | Admitting: Family Medicine

## 2022-10-22 VITALS — BP 109/65 | HR 71 | Temp 97.8°F | Resp 18 | Wt 216.4 lb

## 2022-10-22 DIAGNOSIS — R350 Frequency of micturition: Secondary | ICD-10-CM

## 2022-10-22 DIAGNOSIS — C541 Malignant neoplasm of endometrium: Secondary | ICD-10-CM | POA: Diagnosis not present

## 2022-10-22 DIAGNOSIS — I498 Other specified cardiac arrhythmias: Secondary | ICD-10-CM | POA: Diagnosis not present

## 2022-10-22 NOTE — Progress Notes (Signed)
   Acute Office Visit  Subjective:     Patient ID: Emily [name], female    DOB: 28-Oct-1955, 67 y.o.   MRN: [id number]  Chief Complaint  Patient presents with   Urinary Frequency    Pt says after radiation started she has had to use the restroom every hour. 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 She would also like a referral to a new Oncologist.      Patient is in today for paperwork completion.   She reports that ever since she had radiation for endometrial cancer, she has had urinary frequency, needing to use the restroom every hour so. States that this hasn't been a problem at work, but she recently changed departments and new manager is requesting she get accomodation paperwork completed to be able to take more frequent restroom breaks and have a desk closer to the bathroom. No new complaints.    At the end of the visit she also mentions that she has noticed her heart fluttering some today. She doesn't think she is anxious. Patient denies any chest pain, palpitations, dyspnea, wheezing, edema, recurrent headaches, vision changes.      All review of systems negative except what is listed in the HPI      Objective:    BP 109/65 (BP Location: Right Arm, Patient Position: Sitting, Cuff Size: Large)   Pulse 71   Temp 97.8 F (36.6 C) (Temporal)   Resp 18   Wt 216 lb 6.4 oz (98.2 kg)   SpO2 95%   BMI 36.01 kg/m    Physical Exam Vitals reviewed.  Constitutional:      Appearance: Normal appearance.  Cardiovascular:     Rate and Rhythm: Normal rate.  Pulmonary:     Effort: Pulmonary effort is normal.     Breath sounds: Normal breath sounds.  Neurological:     General: No focal deficit present.     Mental Status: She is alert and oriented to person, place, and time. Mental status is at baseline.  Psychiatric:        Mood and Affect: Mood normal.        Behavior: Behavior normal.        Thought Content: Thought content  normal.        Judgment: Judgment normal.     No results found for any visits on 10/22/22.      Assessment & Plan:   Problem List Items Addressed This Visit     Endometrial cancer [facility]) - Primary Referral for new oncologist, states she doesn't feel like current provider is a good for her.   Relevant Orders   Ambulatory referral to Hematology / Oncology   Urinary frequency Chronic following radiation; no new symptoms. Work accomodation form filled out for restroom breaks and a desk closer to the restroom.   Other Visit Diagnoses     Fluttering heart     Patient states that she started noticing some heart fluttering today. No other symptoms.  EKG today is NSR 66, no signs of ischemia Declined additional workup at this time. Patient aware of signs/symptoms requiring further/urgent evaluation.    Relevant Orders   EKG 12-Lead       No orders of the defined types were placed in this encounter.   Return if symptoms worsen or fail to improve.  Terrilyn Saver, NP

## 2022-10-23 ENCOUNTER — Telehealth: Payer: Medicare Other | Admitting: Nurse Practitioner

## 2022-10-23 ENCOUNTER — Telehealth: Payer: Self-pay | Admitting: Oncology

## 2022-10-23 DIAGNOSIS — R1084 Generalized abdominal pain: Secondary | ICD-10-CM | POA: Diagnosis not present

## 2022-10-23 DIAGNOSIS — R112 Nausea with vomiting, unspecified: Secondary | ICD-10-CM | POA: Diagnosis not present

## 2022-10-23 MED ORDER — ONDANSETRON HCL 4 MG PO TABS: 4.0000 mg | ORAL_TABLET | Freq: Three times a day (TID) | ORAL | 0 refills | Status: DC | PRN

## 2022-10-23 NOTE — Progress Notes (Signed)
E-Visit for Nausea and Vomiting   We are sorry that you are not feeling well. Here is how we plan to help!  Based on what you have shared with me it looks like you have a Virus that is irritating your GI tract.  Vomiting is the forceful emptying of a portion of the stomach's content through the mouth.  Although nausea and vomiting can make you feel miserable, it's important to remember that these are not diseases, but rather symptoms of an underlying illness.  When we treat short term symptoms, we always caution that any symptoms that persist should be fully evaluated in a medical office.  I have prescribed a medication that will help alleviate your symptoms and allow you to stay hydrated:  Zofran 4 mg 1 tablet every 8 hours as needed for nausea and vomiting  HOME CARE: Drink clear liquids.  This is very important! Dehydration (the lack of fluid) can lead to a serious complication.  Start off with 1 tablespoon every 5 minutes for 8 hours. You may begin eating bland foods after 8 hours without vomiting.  Start with saltine crackers, white bread, rice, mashed potatoes, applesauce. After 48 hours on a bland diet, you may resume a normal diet. Try to go to sleep.  Sleep often empties the stomach and relieves the need to vomit.  GET HELP RIGHT AWAY IF: If cramps worsen  Your symptoms do not improve or worsen within 2 days after treatment. You have a fever for over 3 days. You cannot keep down fluids after trying the medication.  MAKE SURE YOU:  Understand these instructions. Will watch your condition. Will get help right away if you are not doing well or get worse.    Thank you for choosing an e-visit.  Your e-visit answers were reviewed by a board certified advanced clinical practitioner to complete your personal care plan. Depending upon the condition, your plan could have included both over the counter or prescription medications.  Please review your pharmacy choice. Make sure the  pharmacy is open so you can pick up prescription now. If there is a problem, you may contact your provider through [employer] and have the prescription routed to another pharmacy.  Your safety is important to [place]. If you have drug allergies check your prescription carefully.   For the next 24 hours you can use MyChart to ask questions about today's visit, request a non-urgent call back, or ask for a work or school excuse. You will get an email in the next two days asking about your experience. I hope that your e-visit has been valuable and will speed your recovery.   Mary-Margaret Hassell Done, FNP   5-10 minutes spent reviewing and documenting in chart.

## 2022-10-23 NOTE — Telephone Encounter (Signed)
Left 2 messages regarding referral to [facility].  Requested a return call.

## 2022-10-25 ENCOUNTER — Other Ambulatory Visit: Payer: Self-pay | Admitting: Family Medicine

## 2022-10-25 DIAGNOSIS — F3289 Other specified depressive episodes: Secondary | ICD-10-CM

## 2022-10-30 ENCOUNTER — Encounter: Payer: Self-pay | Admitting: *Deleted

## 2022-10-30 NOTE — Progress Notes (Signed)
Received this referral on 10/22/22. Conferred with the GYN Navigator as the patient is a current patient in their clinic and asking to change providers. While Dr Marin Olp could manage medical oncology treatment, she would still need to be seen/managed by their clinic. Elmo Putt attempted to call patient multiple times to see if she would like to switch providers at the [facility] clinic, or if a referral to an outside health system would be preferred. Patient did not return those calls.  Yesterday and today I attempted to call patient. Voicemail was left yesterday morning requesting call back. No contact has been made with the patient.   Message sent to referral coordinator to close referral and notify the referring provider that we cannot get in touch with patient.   Oncology Nurse Navigator Documentation     10/30/2022   10:15 AM  Oncology Nurse Navigator Flowsheets  Navigator Location [facility]  Referral Date to RadOnc/MedOnc 10/22/2022  Navigator Encounter Type Telephone  Telephone Outgoing Call  Time Spent with Patient 15

## 2022-10-31 ENCOUNTER — Encounter: Payer: Self-pay | Admitting: *Deleted

## 2022-11-13 ENCOUNTER — Telehealth: Payer: Self-pay | Admitting: Family Medicine

## 2022-11-13 NOTE — Telephone Encounter (Signed)
Copied from CRM [id number]. Topic: Medicare AWV >> Nov 13, 2022 10:17 AM Payton Doughty wrote: Reason for CRM: Called patient to schedule Medicare Annual Wellness Visit (AWV). Left message for patient to call back and schedule Medicare Annual Wellness Visit (AWV).  Last date of AWV: NONE  Please schedule an appointment at any time with Donne Anon, CMA  .  If any questions, please contact me.  Thank you ,  Verlee Rossetti; Care Guide Ambulatory Clinical Support [place] [facility] Direct Dial: [phone]

## 2022-11-26 ENCOUNTER — Other Ambulatory Visit: Payer: Self-pay | Admitting: Family Medicine

## 2022-11-26 DIAGNOSIS — I10 Essential (primary) hypertension: Secondary | ICD-10-CM

## 2022-11-26 DIAGNOSIS — E782 Mixed hyperlipidemia: Secondary | ICD-10-CM

## 2022-12-03 ENCOUNTER — Encounter: Payer: Self-pay | Admitting: *Deleted

## 2023-01-18 ENCOUNTER — Other Ambulatory Visit: Payer: Self-pay | Admitting: Family Medicine

## 2023-01-18 DIAGNOSIS — E039 Hypothyroidism, unspecified: Secondary | ICD-10-CM

## 2023-01-20 ENCOUNTER — Other Ambulatory Visit: Payer: Self-pay | Admitting: Family Medicine

## 2023-01-20 DIAGNOSIS — E039 Hypothyroidism, unspecified: Secondary | ICD-10-CM

## 2023-02-01 ENCOUNTER — Telehealth: Payer: Medicare Other | Admitting: Physician Assistant

## 2023-02-01 DIAGNOSIS — S0181XA Laceration without foreign body of other part of head, initial encounter: Secondary | ICD-10-CM

## 2023-02-01 NOTE — Progress Notes (Signed)
To determine if you need stitches or have the laceration glued , I feel your condition warrants further evaluation in person or with Video Visit and I recommend that you be seen in a face to face visit.    NOTE: There will be NO CHARGE for this eVisit   If you are having a true medical emergency please call 911.      For an urgent face to face visit, [facility] has eight urgent care centers for your convenience:   NEW!! [facility] Health Urgent Care Center at [facility] Get Driving Directions [phone] [address], [address] [place], [address]    [facility] Health Urgent Care Center at [facility] Get Driving Directions [phone] [address] [place], [place] [address]   [facility] Health Urgent Care Center [facility]) Get Driving Directions [phone] [address] [place], [place] [address]  [facility] Care Center [facility]) Get Driving Directions [phone] [address] [place],  [place]  [address]  [facility] Health Urgent Care Center [facility] - at [employer]  [phone] [address] [place],  [place] [address]   [facility] Health Urgent Care at [facility] Get Driving Directions [phone] [address] [place], [place] [address]   [facility] Health Urgent Care at [facility] Get Driving Directions  [phone] [address] [place], [place] [address]   [facility] Health Urgent Care at [facility] Directions [phone] [address] [place], [place] [address]  Your MyChart E-visit questionnaire answers were reviewed by a board certified advanced clinical practitioner to complete your personal care plan based on your specific symptoms.  Thank you for using e-Visits.

## 2023-02-11 ENCOUNTER — Telehealth: Payer: Medicare Other | Admitting: Physician Assistant

## 2023-02-11 DIAGNOSIS — R112 Nausea with vomiting, unspecified: Secondary | ICD-10-CM

## 2023-02-11 MED ORDER — PROMETHAZINE HCL 25 MG RE SUPP: 25.0000 mg | Freq: Three times a day (TID) | RECTAL | 0 refills | Status: DC | PRN

## 2023-02-11 NOTE — Progress Notes (Signed)
E-Visit for Nausea and Vomiting   We are sorry that you are not feeling well. Here is how we plan to help!  Based on what you have shared with me it looks like you have a Virus that is irritating your GI tract.  Vomiting is the forceful emptying of a portion of the stomach's content through the mouth.  Although nausea and vomiting can make you feel miserable, it's important to remember that these are not diseases, but rather symptoms of an underlying illness.  When we treat short term symptoms, we always caution that any symptoms that persist should be fully evaluated in a medical office.  I have prescribed a medication that will help alleviate your symptoms and allow you to stay hydrated:  Promethazine 25 mg Insert 1 suppository every 8-12 hours as needed for nausea and vomiting.  HOME CARE: Drink clear liquids.  This is very important! Dehydration (the lack of fluid) can lead to a serious complication.  Start off with 1 tablespoon every 5 minutes for 8 hours. You may begin eating bland foods after 8 hours without vomiting.  Start with saltine crackers, white bread, rice, mashed potatoes, applesauce. After 48 hours on a bland diet, you may resume a normal diet. Try to go to sleep.  Sleep often empties the stomach and relieves the need to vomit.  GET HELP RIGHT AWAY IF:  Your symptoms do not improve or worsen within 2 days after treatment. You have a fever for over 3 days. You cannot keep down fluids after trying the medication.  MAKE SURE YOU:  Understand these instructions. Will watch your condition. Will get help right away if you are not doing well or get worse.    Thank you for choosing an e-visit.  Your e-visit answers were reviewed by a board certified advanced clinical practitioner to complete your personal care plan. Depending upon the condition, your plan could have included both over the counter or prescription medications.  Please review your pharmacy choice. Make sure  the pharmacy is open so you can pick up prescription now. If there is a problem, you may contact your provider through [employer] and have the prescription routed to another pharmacy.  Your safety is important to [place]. If you have drug allergies check your prescription carefully.   For the next 24 hours you can use MyChart to ask questions about today's visit, request a non-urgent call back, or ask for a work or school excuse. You will get an email in the next two days asking about your experience. I hope that your e-visit has been valuable and will speed your recovery.  I have spent 5 minutes in review of e-visit questionnaire, review and updating patient chart, medical decision making and response to patient.   Margaretann Loveless, PA-C

## 2023-02-14 ENCOUNTER — Other Ambulatory Visit: Payer: Self-pay | Admitting: Family Medicine

## 2023-02-14 DIAGNOSIS — K2100 Gastro-esophageal reflux disease with esophagitis, without bleeding: Secondary | ICD-10-CM

## 2023-02-25 ENCOUNTER — Telehealth: Payer: Medicare Other | Admitting: Physician Assistant

## 2023-02-25 ENCOUNTER — Other Ambulatory Visit: Payer: Self-pay | Admitting: Family Medicine

## 2023-02-25 DIAGNOSIS — E782 Mixed hyperlipidemia: Secondary | ICD-10-CM

## 2023-02-25 DIAGNOSIS — M549 Dorsalgia, unspecified: Secondary | ICD-10-CM | POA: Diagnosis not present

## 2023-02-25 MED ORDER — CYCLOBENZAPRINE HCL 10 MG PO TABS: 10.0000 mg | ORAL_TABLET | Freq: Three times a day (TID) | ORAL | 0 refills | Status: AC | PRN

## 2023-02-25 MED ORDER — NAPROXEN 500 MG PO TABS: 500.0000 mg | ORAL_TABLET | Freq: Two times a day (BID) | ORAL | 0 refills | Status: DC

## 2023-02-25 NOTE — Progress Notes (Signed)

## 2023-02-25 NOTE — Progress Notes (Signed)
I have spent 5 minutes in review of e-visit questionnaire, review and updating patient chart, medical decision making and response to patient.   William Cody Martin, PA-C    

## 2023-02-25 NOTE — Progress Notes (Signed)
Message sent to patient requesting further input regarding current symptoms. Awaiting patient response.  

## 2023-02-27 ENCOUNTER — Telehealth: Payer: Self-pay | Admitting: Family Medicine

## 2023-02-27 NOTE — Telephone Encounter (Signed)
Copied from CRM [id number]. Topic: Medicare AWV >> Feb 27, 2023 11:48 AM Payton Doughty wrote: Reason for CRM: LM 02/27/2023 to schedule AWV   Verlee Rossetti; Care Guide Ambulatory Clinical Support [place] [facility] Direct Dial: [phone]

## 2023-03-22 ENCOUNTER — Telehealth: Payer: Medicare Other | Admitting: Nurse Practitioner

## 2023-03-22 DIAGNOSIS — M549 Dorsalgia, unspecified: Secondary | ICD-10-CM

## 2023-03-22 NOTE — Progress Notes (Signed)
Based on what you shared with me it looks like you have back pain,that should be evaluated in a face to face office visit. You were just treated fir this 3 weeks ago. You need a face to face visit to see if can figure out what is causing back pain.  NOTE: There will be NO CHARGE for this eVisit   If you are having a true medical emergency please call 911.      For an urgent face to face visit, [facility] has six urgent care centers for your convenience:     [facility] Health Urgent Care Center at [facility] Directions [phone] [address] [place], [place] [address]    [facility] Health Urgent Care Center [facility]) Get Driving Directions [phone] [address] [place], [place] [address]  [facility] Health Urgent Care Center [facility]) Get Driving Directions [phone] [address] [place],  [place]  [address]  [facility] Health Urgent Care at [facility] Get Driving Directions [phone] [address] [place], [place] [address]   [facility] Health Urgent Care at [facility] Get Driving Directions  [phone] [address] [place], [place] [address]   [facility] Health Urgent Care at [facility] Directions [phone] [address] [place], [place] [address]  Your MyChart E-visit questionnaire answers were reviewed by a board certified advanced clinical practitioner to complete your personal care plan based on your specific symptoms.  Thank you for using e-Visits.

## 2023-04-06 ENCOUNTER — Other Ambulatory Visit: Payer: Self-pay | Admitting: Family Medicine

## 2023-04-06 DIAGNOSIS — F3289 Other specified depressive episodes: Secondary | ICD-10-CM

## 2023-04-22 ENCOUNTER — Ambulatory Visit (INDEPENDENT_AMBULATORY_CARE_PROVIDER_SITE_OTHER): Payer: Medicare Other | Admitting: Family Medicine

## 2023-04-22 ENCOUNTER — Ambulatory Visit (HOSPITAL_BASED_OUTPATIENT_CLINIC_OR_DEPARTMENT_OTHER)
Admission: RE | Admit: 2023-04-22 | Discharge: 2023-04-22 | Disposition: A | Discharge status: Home / Self Care | Payer: Medicare Other | Source: Ambulatory Visit | Attending: Family Medicine | Admitting: Family Medicine

## 2023-04-22 VITALS — BP 121/63 | HR 94 | Ht 65.0 in | Wt 233.0 lb

## 2023-04-22 DIAGNOSIS — M549 Dorsalgia, unspecified: Secondary | ICD-10-CM | POA: Diagnosis not present

## 2023-04-22 DIAGNOSIS — M47816 Spondylosis without myelopathy or radiculopathy, lumbar region: Secondary | ICD-10-CM | POA: Diagnosis not present

## 2023-04-22 DIAGNOSIS — C541 Malignant neoplasm of endometrium: Secondary | ICD-10-CM

## 2023-04-22 DIAGNOSIS — J9811 Atelectasis: Secondary | ICD-10-CM | POA: Diagnosis not present

## 2023-04-22 DIAGNOSIS — R0602 Shortness of breath: Secondary | ICD-10-CM | POA: Diagnosis not present

## 2023-04-22 DIAGNOSIS — M545 Low back pain, unspecified: Secondary | ICD-10-CM | POA: Insufficient documentation

## 2023-04-22 DIAGNOSIS — R0609 Other forms of dyspnea: Secondary | ICD-10-CM | POA: Diagnosis not present

## 2023-04-22 DIAGNOSIS — M439 Deforming dorsopathy, unspecified: Secondary | ICD-10-CM | POA: Diagnosis not present

## 2023-04-22 LAB — CBC WITH DIFFERENTIAL/PLATELET
Basophils Absolute: 0.1 10*3/uL (ref 0.0–0.1)
Basophils Relative: 0.9 % (ref 0.0–3.0)
Eosinophils Absolute: 0.1 10*3/uL (ref 0.0–0.7)
Eosinophils Relative: 0.7 % (ref 0.0–5.0)
HCT: 39.5 % (ref 36.0–46.0)
Hemoglobin: 13.1 g/dL (ref 12.0–15.0)
Lymphocytes Relative: 12.4 % (ref 12.0–46.0)
Lymphs Abs: 1 10*3/uL (ref 0.7–4.0)
MCHC: 33.2 g/dL (ref 30.0–36.0)
MCV: 82.2 fl (ref 78.0–100.0)
Monocytes Absolute: 0.5 10*3/uL (ref 0.1–1.0)
Monocytes Relative: 6.5 % (ref 3.0–12.0)
Neutro Abs: 6.5 10*3/uL (ref 1.4–7.7)
Neutrophils Relative %: 79.5 % — ABNORMAL HIGH (ref 43.0–77.0)
Platelets: 178 10*3/uL (ref 150.0–400.0)
RBC: 4.8 Mil/uL (ref 3.87–5.11)
RDW: 14.8 % (ref 11.5–15.5)
WBC: 8.1 10*3/uL (ref 4.0–10.5)

## 2023-04-22 LAB — COMPREHENSIVE METABOLIC PANEL
ALT: 14 U/L (ref 0–35)
AST: 11 U/L (ref 0–37)
Albumin: 4 g/dL (ref 3.5–5.2)
Alkaline Phosphatase: 109 U/L (ref 39–117)
BUN: 15 mg/dL (ref 6–23)
CO2: 26 meq/L (ref 19–32)
Calcium: 8.4 mg/dL (ref 8.4–10.5)
Chloride: 103 meq/L (ref 96–112)
Creatinine, Ser: 1.17 mg/dL (ref 0.40–1.20)
GFR: 48.51 mL/min — ABNORMAL LOW (ref >60.00)
Glucose, Bld: 95 mg/dL (ref 70–99)
Potassium: 3.7 meq/L (ref 3.5–5.1)
Sodium: 138 meq/L (ref 135–145)
Total Bilirubin: 0.8 mg/dL (ref 0.2–1.2)
Total Protein: 6.7 g/dL (ref 6.0–8.3)

## 2023-04-22 LAB — BRAIN NATRIURETIC PEPTIDE: Pro B Natriuretic peptide (BNP): 17 pg/mL (ref 0.0–100.0)

## 2023-04-22 NOTE — Progress Notes (Signed)
Acute Office Visit  Subjective:     Patient ID: Emily [name], female    DOB: [birth year], 67 y.o.   MRN: [id number]  Chief Complaint  Patient presents with   Medical Management of Chronic Issues   Back Pain     Patient is in today for back pain and dyspnea.   Discussed the use of AI scribe software for clinical note transcription with the patient, who gave verbal consent to proceed.  History of Present Illness   The patient presents with a chief complaint of lower back pain, particularly on the left side, which has been ongoing for approximately two months. The pain is localized and does not radiate down the leg. The patient denies any precipitating events such as a fall. The back pain is exacerbated by walking and has been associated with shortness of breath, which started about a month after the onset of the back pain. The patient reports becoming easily winded with activity, but denies any pain associated with breathing and denies chest pain.    They have not noticed any changes in bowel movements. The patient has been avoiding ibuprofen due to stomach upset and a family history of stomach aneurysm associated with ibuprofen use - her son passed about 6 months ago.  The patient has tried Flexeril, a muscle relaxer, for the back pain, which provided some relief. They have a history of a negative reaction to prednisone and prefer not to take it. The patient denies any changes in weight or leg swelling. They have never smoked and do not report any significant coughing.  The patient's daily activities include work and visiting a sibling in a nursing home. They express concern about the time commitment of physical therapy. The patient also mentions a recent personal loss, with their son passing away. They had received a referral to an oncologist around the time of their son's death, but lost the contact information and have not followed up.         Wt Readings from Last 3  Encounters:  04/22/23 233 lb (105.7 kg)  10/22/22 216 lb 6.4 oz (98.2 kg)  03/22/22 212 lb (96.2 kg)      Review of Systems  Musculoskeletal:  Positive for back pain.   All review of systems negative except what is listed in the HPI      Objective:    BP 121/63   Pulse 94   Ht 5\' 5"  (1.651 m)   Wt 233 lb (105.7 kg)   SpO2 96%   BMI 38.77 kg/m    Physical Exam Vitals reviewed.  Constitutional:      Appearance: Normal appearance.  Cardiovascular:     Rate and Rhythm: Normal rate and regular rhythm.  Pulmonary:     Effort: Pulmonary effort is normal.     Breath sounds: Normal breath sounds.  Musculoskeletal:     Right lower leg: No edema.     Left lower leg: No edema.     Comments: Left SI joint tender to palpation   Neurological:     General: No focal deficit present.     Mental Status: She is alert and oriented to person, place, and time. Mental status is at baseline.  Psychiatric:        Mood and Affect: Mood normal.        Behavior: Behavior normal.        Thought Content: Thought content normal.        Judgment: Judgment normal.  No results found for any visits on 04/22/23.      Assessment & Plan:   Problem List Items Addressed This Visit   None Visit Diagnoses     Acute left-sided low back pain without sciatica    -  Primary Localized to left lower back, no radiation to legs. No recent trauma. Pain exacerbated by walking. Flexeril provided minimal relief. Patient reluctant to use NSAIDs due to gastrointestinal discomfort. -Order lumbar spine X-ray. -Provide home exercises for back pain, SI joint pain.  -Consider referral to orthopedics for further evaluation and treatment if not improving with supportive measures.     Relevant Orders   DG Lumbar Spine 2-3 Views   DOE (dyspnea on exertion)     New onset over the past month, exacerbated by activity. No associated cough or chest pain. No history of smoking. -Order chest X-ray and blood  work. -Consider echocardiogram depending on lab results. -Consider EKG if no improvement. Declined today as she has not had any chest pain.     Relevant Orders   CBC with Differential/Platelet   Comprehensive metabolic panel   Brain natriuretic peptide   DG Chest 2 View       No orders of the defined types were placed in this encounter.   Return if symptoms worsen or fail to improve, for (pending results).  Clayborne Dana, NP

## 2023-04-22 NOTE — Patient Instructions (Signed)
BACK PAIN AVS  For many people, back pain returns. Since low back pain is rarely dangerous, it is often a condition that people can learn to manage on their own.  Remain active. It is stressful on the back to sit or stand in one place. Do not sit, drive, or stand in one place for more than 30 minutes at a time. Take short walks on level surfaces as soon as pain allows. Try to increase the length of time you walk each day.  Do not stay in bed. Resting more than 1 or 2 days can delay your recovery.  Do not avoid exercise or work. Your body is made to move. It is not dangerous to be active, even though your back may hurt. Your back will likely heal faster if you return to being active before your pain is gone.  Pay attention to your body when you  bend and lift. Many people have less discomfort when lifting if they bend their knees, keep the load close to their bodies, and avoid twisting. Often, the most comfortable positions are those that put less stress on your recovering back.  Find a comfortable position to sleep. Use a firm mattress and lie on your side with your knees slightly bent. If you lie on your back, put a pillow under your knees.  Only take over-the-counter or prescription medicines as directed by your caregiver. Over-the-counter medicines to reduce pain and inflammation are often the most helpful. Your caregiver may prescribe muscle relaxant drugs. These medicines help dull your pain so you can more quickly return to your normal activities and healthy exercise.  Put ice on the injured area.  Put ice in a plastic bag.  Place a towel between your skin and the bag.  Leave the ice on for 15 to 20 minutes, 3 to 4 times a day for the first 2 to 3 days. After that, ice and heat may be alternated to reduce pain and spasms.  Ask your caregiver about trying back exercises and gentle massage. This may be of some benefit.  Avoid feeling anxious or stressed. Stress increases muscle tension and can  worsen back pain. It is important to recognize when you are anxious or stressed and learn ways to manage it. Exercise is a great option.  SEEK IMMEDIATE MEDICAL CARE IF:  You have pain that radiates from your back into your legs.  You develop new bowel or bladder control problems.  You have unusual weakness or numbness in your arms or legs.  You develop nausea or vomiting.  You develop abdominal pain.  You feel faint.

## 2023-04-23 NOTE — Addendum Note (Signed)
Addended by: Hyman Hopes B on: 04/23/2023 10:28 AM   Modules accepted: Orders

## 2023-05-07 ENCOUNTER — Other Ambulatory Visit: Payer: Self-pay | Admitting: Family Medicine

## 2023-05-07 DIAGNOSIS — E039 Hypothyroidism, unspecified: Secondary | ICD-10-CM

## 2023-05-08 ENCOUNTER — Inpatient Hospital Stay: Payer: Medicare Other | Admitting: Hematology & Oncology

## 2023-05-08 ENCOUNTER — Encounter: Payer: Self-pay | Admitting: Hematology & Oncology

## 2023-05-08 ENCOUNTER — Inpatient Hospital Stay: Payer: Medicare Other | Attending: Hematology & Oncology

## 2023-05-12 ENCOUNTER — Encounter: Payer: Self-pay | Admitting: Family Medicine

## 2023-05-12 ENCOUNTER — Ambulatory Visit (INDEPENDENT_AMBULATORY_CARE_PROVIDER_SITE_OTHER): Payer: Medicare Other | Admitting: Family Medicine

## 2023-05-12 VITALS — BP 124/75 | HR 79 | Ht 65.0 in | Wt 232.2 lb

## 2023-05-12 DIAGNOSIS — R0602 Shortness of breath: Secondary | ICD-10-CM

## 2023-05-12 DIAGNOSIS — R5383 Other fatigue: Secondary | ICD-10-CM | POA: Diagnosis not present

## 2023-05-12 DIAGNOSIS — U071 COVID-19: Secondary | ICD-10-CM

## 2023-05-12 DIAGNOSIS — N289 Disorder of kidney and ureter, unspecified: Secondary | ICD-10-CM

## 2023-05-12 DIAGNOSIS — R351 Nocturia: Secondary | ICD-10-CM

## 2023-05-12 DIAGNOSIS — R7989 Other specified abnormal findings of blood chemistry: Secondary | ICD-10-CM | POA: Diagnosis not present

## 2023-05-12 NOTE — Progress Notes (Signed)
Acute Office Visit  Subjective:     Patient ID: Emily [name], female    DOB: [birth year], 67 y.o.   MRN: [id number]  Chief Complaint  Patient presents with   Follow-up    FMLA paperwork  Pt tested COVID positive 05/04/2023, still has SOB and back pain  Pt is requesting a higher dosage of hydroxine  Pt states when taking Zofran she gets a "real bad headache"    HPI Patient is in today for fatigue, dyspnea, post-COVID.   Discussed the use of AI scribe software for clinical note transcription with the patient, who gave verbal consent to proceed.  History of Present Illness   The patient, with a history of endometrial cancer, presented with ongoing symptoms following a recent COVID-19 infection. She tested positive for COVID-19 approximately a week prior to the consultation, and since then, she has experienced a range of symptoms including fever, ear pain, and significant shortness of breath. The patient reported that the shortness of breath has been worse this week due to the illness, but it has been a persistent issue for some time. She noted that simple activities such as walking to the bathroom leave her winded and needing several minutes to recover her breath.  The patient also reported frequent nocturia, which has been disrupting her sleep. She mentioned that she has been taking Vistaril for sleep, but it has not been effective in managing this issue. She saw urogynecology earlier this year, but has not followed up with them.  The patient's other significant medical history includes a recent battle with endometrial cancer. She mentioned that she has been feeling fatigued and has not had much rest since her cancer treatment.   The patient works full-time and has been struggling with the physical demands of her job due to her health issues, especially worse since COVID symptoms started last week. She expressed a desire to take a leave of absence from work until she recovers.            ROS All review of systems negative except what is listed in the HPI      Objective:    BP 124/75 (BP Location: Right Arm, Patient Position: Sitting, Cuff Size: Large)   Pulse 79   Ht 5\' 5"  (1.651 m)   Wt 232 lb 3.2 oz (105.3 kg)   SpO2 97%   BMI 38.64 kg/m    Physical Exam Vitals reviewed.  Constitutional:      Appearance: Normal appearance.  Cardiovascular:     Rate and Rhythm: Normal rate and regular rhythm.  Pulmonary:     Effort: Pulmonary effort is normal.     Breath sounds: Normal breath sounds.  Musculoskeletal:     Right lower leg: No edema.     Left lower leg: No edema.  Neurological:     General: No focal deficit present.     Mental Status: She is alert and oriented to person, place, and time. Mental status is at baseline.  Psychiatric:        Mood and Affect: Mood normal.        Behavior: Behavior normal.        Thought Content: Thought content normal.        Judgment: Judgment normal.         No results found for any visits on 05/12/23.      Assessment & Plan:   Problem List Items Addressed This Visit   None Visit Diagnoses  COVID-19    -  Primary   Relevant Orders   CBC with Differential/Platelet   Basic metabolic panel   X52 and Folate Panel   IBC + Ferritin   TSH   D-Dimer, Quantitative   Shortness of breath       Relevant Orders   CBC with Differential/Platelet   Basic metabolic panel   W41 and Folate Panel   IBC + Ferritin   TSH   D-Dimer, Quantitative   Fatigue, unspecified type       Relevant Orders   CBC with Differential/Platelet   Basic metabolic panel   L24 and Folate Panel   IBC + Ferritin   TSH   D-Dimer, Quantitative   Nocturia             COVID-19 Recent positive test with ongoing symptoms including fatigue, shortness of breath, and nausea. No chest pain or leg swelling. -Order labs including D-dimer to rule out pulmonary embolism. -Consider follow-up imaging or referral if symptoms do not  start improving given that they have been ongoing for awhile now, only recently worsened with COVID infection. For severe symptoms, go to the ED.  Nocturia Frequent urination disrupting sleep. -Recommend she go back to urogynecologist for further evaluation and management. She will call to schedule.        No orders of the defined types were placed in this encounter.   Return if symptoms worsen or fail to improve.  Clayborne Dana, NP

## 2023-05-13 ENCOUNTER — Ambulatory Visit (HOSPITAL_BASED_OUTPATIENT_CLINIC_OR_DEPARTMENT_OTHER)
Admission: RE | Admit: 2023-05-13 | Discharge: 2023-05-13 | Disposition: A | Discharge status: Home / Self Care | Payer: Medicare Other | Source: Ambulatory Visit | Attending: Family Medicine | Admitting: Family Medicine

## 2023-05-13 DIAGNOSIS — R7989 Other specified abnormal findings of blood chemistry: Secondary | ICD-10-CM | POA: Diagnosis not present

## 2023-05-13 DIAGNOSIS — K449 Diaphragmatic hernia without obstruction or gangrene: Secondary | ICD-10-CM | POA: Diagnosis not present

## 2023-05-13 DIAGNOSIS — R0602 Shortness of breath: Secondary | ICD-10-CM | POA: Insufficient documentation

## 2023-05-13 LAB — B12 AND FOLATE PANEL
Folate: 11.5 ng/mL (ref >5.9)
Vitamin B-12: 196 pg/mL — ABNORMAL LOW (ref 211–911)

## 2023-05-13 LAB — BASIC METABOLIC PANEL
BUN: 19 mg/dL (ref 6–23)
CO2: 22 meq/L (ref 19–32)
Calcium: 9.1 mg/dL (ref 8.4–10.5)
Chloride: 104 meq/L (ref 96–112)
Creatinine, Ser: 1.27 mg/dL — ABNORMAL HIGH (ref 0.40–1.20)
GFR: 43.95 mL/min — ABNORMAL LOW (ref >60.00)
Glucose, Bld: 95 mg/dL (ref 70–99)
Potassium: 3.8 meq/L (ref 3.5–5.1)
Sodium: 139 meq/L (ref 135–145)

## 2023-05-13 LAB — TSH: TSH: 2.02 u[IU]/mL (ref 0.35–5.50)

## 2023-05-13 LAB — CBC WITH DIFFERENTIAL/PLATELET
Basophils Absolute: 0.1 10*3/uL (ref 0.0–0.1)
Basophils Relative: 0.8 % (ref 0.0–3.0)
Eosinophils Absolute: 0.1 10*3/uL (ref 0.0–0.7)
Eosinophils Relative: 0.5 % (ref 0.0–5.0)
HCT: 41.9 % (ref 36.0–46.0)
Hemoglobin: 13.7 g/dL (ref 12.0–15.0)
Lymphocytes Relative: 13.3 % (ref 12.0–46.0)
Lymphs Abs: 1.3 10*3/uL (ref 0.7–4.0)
MCHC: 32.7 g/dL (ref 30.0–36.0)
MCV: 82.6 fL (ref 78.0–100.0)
Monocytes Absolute: 0.7 10*3/uL (ref 0.1–1.0)
Monocytes Relative: 6.8 % (ref 3.0–12.0)
Neutro Abs: 7.8 10*3/uL — ABNORMAL HIGH (ref 1.4–7.7)
Neutrophils Relative %: 78.6 % — ABNORMAL HIGH (ref 43.0–77.0)
Platelets: 197 10*3/uL (ref 150.0–400.0)
RBC: 5.08 Mil/uL (ref 3.87–5.11)
RDW: 14.7 % (ref 11.5–15.5)
WBC: 10 10*3/uL (ref 4.0–10.5)

## 2023-05-13 LAB — IBC + FERRITIN
Ferritin: 21.8 ng/mL (ref 10.0–291.0)
Iron: 63 ug/dL (ref 42–145)
Saturation Ratios: 18.3 % — ABNORMAL LOW (ref 20.0–50.0)
TIBC: 344.4 ug/dL (ref 250.0–450.0)
Transferrin: 246 mg/dL (ref 212.0–360.0)

## 2023-05-13 LAB — D-DIMER, QUANTITATIVE: D-Dimer, Quant: 0.54 ug{FEU}/mL — ABNORMAL HIGH (ref <0.50)

## 2023-05-13 MED ORDER — IOHEXOL 350 MG/ML SOLN
75.0000 mL | Freq: Once | INTRAVENOUS | Status: AC | PRN
  Administered 2023-05-13: 75 mL via INTRAVENOUS

## 2023-05-13 NOTE — Addendum Note (Signed)
Addended by: Hyman Hopes B on: 05/13/2023 07:41 AM   Modules accepted: Orders

## 2023-05-14 ENCOUNTER — Encounter: Payer: Self-pay | Admitting: Family Medicine

## 2023-05-14 DIAGNOSIS — I700 Atherosclerosis of aorta: Secondary | ICD-10-CM | POA: Insufficient documentation

## 2023-05-14 NOTE — Addendum Note (Signed)
Addended by: Hyman Hopes B on: 05/14/2023 08:03 AM   Modules accepted: Orders

## 2023-05-17 ENCOUNTER — Other Ambulatory Visit: Payer: Self-pay | Admitting: Family Medicine

## 2023-05-17 DIAGNOSIS — I10 Essential (primary) hypertension: Secondary | ICD-10-CM

## 2023-06-18 ENCOUNTER — Ambulatory Visit: Payer: Medicare Other | Admitting: Family Medicine

## 2023-08-13 ENCOUNTER — Telehealth: Payer: Medicare Other | Admitting: Nurse Practitioner

## 2023-08-13 DIAGNOSIS — R6889 Other general symptoms and signs: Secondary | ICD-10-CM | POA: Diagnosis not present

## 2023-08-13 MED ORDER — OSELTAMIVIR PHOSPHATE 75 MG PO CAPS
75.0000 mg | ORAL_CAPSULE | Freq: Two times a day (BID) | ORAL | 0 refills | Status: AC
Start: 2023-08-13 — End: 2023-08-18

## 2023-08-13 NOTE — Progress Notes (Signed)
 E visit for Flu like symptoms   We are sorry that you are not feeling well.  Here is how we plan to help! Based on what you have shared with me it looks like you may have a viral illness caused possibly by Influenza virus.  Influenza or "the flu" is   an infection caused by a respiratory virus. The flu virus is highly contagious and persons who did not receive their yearly flu vaccination may "catch" the flu from close contact.  We have anti-viral medications to treat the viruses that cause this infection. They are not a "cure" and only shorten the course of the infection. These prescriptions are most effective when they are given within the first 2 days of "flu" symptoms. Antiviral medication are indicated if you have a high risk of complications from the flu. You should  also consider an antiviral medication if you are in close contact with someone who is at risk. These medications can help patients avoid complications from the flu  but have side effects that you should know. Possible side effects from Tamiflu  or oseltamivir  include nausea, vomiting, diarrhea, dizziness, headaches, eye redness, sleep problems or other respiratory symptoms. You should not take Tamiflu  if you have an allergy to oseltamivir  or any to the ingredients in Tamiflu .  Based upon your symptoms and potential risk factors I have prescribed Oseltamivir  (Tamiflu ).  It has been sent to your designated pharmacy.  You will take one 75 mg capsule orally twice a day for the next 5 days. and I recommend that you follow the flu symptoms recommendation that I have listed below.  ANYONE WHO HAS FLU SYMPTOMS SHOULD: Stay home. The flu is highly contagious and going out or to work exposes others! Be sure to drink plenty of fluids. Water is fine as well as fruit juices, sodas and electrolyte beverages. You may want to stay away from caffeine or alcohol. If you are nauseated, try taking small sips of liquids. How do you know if you are getting  enough fluid? Your urine should be a pale yellow or almost colorless. Get rest. Taking a steamy shower or using a humidifier may help nasal congestion and ease sore throat pain. Using a saline nasal spray works much the same way. Cough drops, hard candies and sore throat lozenges may ease your cough. Line up a caregiver. Have someone check on you regularly.   GET HELP RIGHT AWAY IF: You cannot keep down liquids or your medications. You become short of breath Your fell like you are going to pass out or loose consciousness. Your symptoms persist after you have completed your treatment plan MAKE SURE YOU  Understand these instructions. Will watch your condition. Will get help right away if you are not doing well or get worse.  Your e-visit answers were reviewed by a board certified advanced clinical practitioner to complete your personal care plan.  Depending on the condition, your plan could have included both over the counter or prescription medications.  If there is a problem please reply  once you have received a response from your provider.  Your safety is important to us .  If you have drug allergies check your prescription carefully.    You can use MyChart to ask questions about today's visit, request a non-urgent call back, or ask for a work or school excuse for 24 hours related to this e-Visit. If it has been greater than 24 hours you will need to follow up with your provider, or enter  a new e-Visit to address those concerns.  You will get an e-mail in the next two days asking about your experience.  I hope that your e-visit has been valuable and will speed your recovery. Thank you for using e-visits.   I have spent 5 minutes in review of e-visit questionnaire, review and updating patient chart, medical decision making and response to patient.   Favor Kreh, PA-C

## 2023-08-14 ENCOUNTER — Other Ambulatory Visit: Payer: Self-pay | Admitting: Family Medicine

## 2023-08-14 ENCOUNTER — Ambulatory Visit: Payer: Medicare Other | Admitting: Family Medicine

## 2023-08-14 DIAGNOSIS — I10 Essential (primary) hypertension: Secondary | ICD-10-CM

## 2023-09-06 ENCOUNTER — Other Ambulatory Visit: Payer: Self-pay | Admitting: Family Medicine

## 2023-09-06 DIAGNOSIS — F3289 Other specified depressive episodes: Secondary | ICD-10-CM

## 2023-09-08 ENCOUNTER — Other Ambulatory Visit: Payer: Self-pay | Admitting: Family Medicine

## 2023-09-08 DIAGNOSIS — E782 Mixed hyperlipidemia: Secondary | ICD-10-CM

## 2023-09-22 ENCOUNTER — Telehealth: Payer: Medicare Other | Admitting: Physician Assistant

## 2023-09-22 DIAGNOSIS — A084 Viral intestinal infection, unspecified: Secondary | ICD-10-CM

## 2023-09-22 MED ORDER — ONDANSETRON 4 MG PO TBDP: 4.0000 mg | ORAL_TABLET | Freq: Three times a day (TID) | ORAL | 0 refills | Status: DC | PRN

## 2023-09-22 NOTE — Progress Notes (Signed)

## 2023-09-23 ENCOUNTER — Telehealth: Payer: Medicare Other | Admitting: Physician Assistant

## 2023-09-23 DIAGNOSIS — K529 Noninfective gastroenteritis and colitis, unspecified: Secondary | ICD-10-CM

## 2023-09-23 DIAGNOSIS — R531 Weakness: Secondary | ICD-10-CM

## 2023-09-23 NOTE — Progress Notes (Signed)
  Because of severity of the weakness noted/associated with your persistent diarrhea, I feel your condition warrants further evaluation and I recommend that you be seen in a face-to-face visit.   NOTE: There will be NO CHARGE for this E-Visit   If you are having a true medical emergency, please call 911.     For an urgent face to face visit, [facility] has multiple urgent care centers for your convenience.  Click the link below for the full list of locations and hours, walk-in wait times, appointment scheduling options and driving directions:  Urgent Care - [place], [place], [place], [place], [place], [place]       Your MyChart E-visit questionnaire answers were reviewed by a board certified advanced clinical practitioner to complete your personal care plan based on your specific symptoms.    Thank you for using e-Visits.

## 2023-10-29 ENCOUNTER — Other Ambulatory Visit: Payer: Self-pay | Admitting: Family Medicine

## 2023-10-29 DIAGNOSIS — I10 Essential (primary) hypertension: Secondary | ICD-10-CM

## 2023-11-19 ENCOUNTER — Telehealth: Admitting: Nurse Practitioner

## 2023-11-19 DIAGNOSIS — R110 Nausea: Secondary | ICD-10-CM

## 2023-11-19 NOTE — Progress Notes (Signed)
 Emily [name],   This is something that you should discuss with your primary care provider. Sorry for any confusion the E-visits are routed to our digital team. Can you go back into Mychart and message Emily [name] directly please  Thank you Mardene Shake    I feel your condition warrants further evaluation and I recommend that you be seen for a face to face visit.  Please contact your primary care physician practice to be seen. Many offices offer virtual options to be seen via video if you are not comfortable going in person to a medical facility at this time.  NOTE: You will NOT be charged for this eVisit.  If you do not have a PCP, [facility] offers a free physician referral service available at [phone]. Our trained staff has the experience, knowledge and resources to put you in touch with a physician who is right for you.    If you are having a true medical emergency please call 911.   Your e-visit answers were reviewed by a board certified advanced clinical practitioner to complete your personal care plan.  Thank you for using e-Visits.

## 2023-12-11 ENCOUNTER — Telehealth: Payer: Self-pay | Admitting: Family Medicine

## 2023-12-11 NOTE — Telephone Encounter (Signed)
 Copied from CRM [id number]. Topic: Medicare AWV >> Dec 11, 2023  3:04 PM Juliana Ocean wrote: Reason for CRM: LVM 12/11/2023 to schedule AWV. Please schedule Virtual or Telehealth visits ONLY.   Rosalee Collins; Care Guide Ambulatory Clinical Support Bureau l [facility] Direct Dial: [phone]

## 2023-12-20 ENCOUNTER — Other Ambulatory Visit: Payer: Self-pay | Admitting: Family Medicine

## 2023-12-20 DIAGNOSIS — E782 Mixed hyperlipidemia: Secondary | ICD-10-CM

## 2024-01-02 ENCOUNTER — Telehealth: Admitting: Physician Assistant

## 2024-01-02 DIAGNOSIS — R3989 Other symptoms and signs involving the genitourinary system: Secondary | ICD-10-CM

## 2024-01-03 MED ORDER — CEPHALEXIN 500 MG PO CAPS: 500.0000 mg | ORAL_CAPSULE | Freq: Two times a day (BID) | ORAL | 0 refills | Status: DC

## 2024-01-03 NOTE — Progress Notes (Signed)

## 2024-01-13 ENCOUNTER — Other Ambulatory Visit: Payer: Self-pay | Admitting: Family Medicine

## 2024-01-13 DIAGNOSIS — I10 Essential (primary) hypertension: Secondary | ICD-10-CM

## 2024-01-18 ENCOUNTER — Other Ambulatory Visit: Payer: Self-pay | Admitting: Family Medicine

## 2024-01-18 DIAGNOSIS — I10 Essential (primary) hypertension: Secondary | ICD-10-CM

## 2024-02-09 ENCOUNTER — Other Ambulatory Visit: Payer: Self-pay | Admitting: Family Medicine

## 2024-02-09 DIAGNOSIS — E039 Hypothyroidism, unspecified: Secondary | ICD-10-CM

## 2024-02-09 DIAGNOSIS — I10 Essential (primary) hypertension: Secondary | ICD-10-CM

## 2024-02-12 ENCOUNTER — Ambulatory Visit: Admitting: Family Medicine

## 2024-02-14 ENCOUNTER — Other Ambulatory Visit: Payer: Self-pay | Admitting: Family Medicine

## 2024-02-14 DIAGNOSIS — F3289 Other specified depressive episodes: Secondary | ICD-10-CM

## 2024-03-10 ENCOUNTER — Other Ambulatory Visit: Payer: Self-pay | Admitting: Medical Genetics

## 2024-03-12 ENCOUNTER — Other Ambulatory Visit: Payer: Self-pay | Admitting: Family Medicine

## 2024-03-12 DIAGNOSIS — E039 Hypothyroidism, unspecified: Secondary | ICD-10-CM

## 2024-03-12 DIAGNOSIS — I10 Essential (primary) hypertension: Secondary | ICD-10-CM

## 2024-03-14 ENCOUNTER — Other Ambulatory Visit: Payer: Self-pay | Admitting: Family Medicine

## 2024-03-14 DIAGNOSIS — F3289 Other specified depressive episodes: Secondary | ICD-10-CM

## 2024-03-17 ENCOUNTER — Ambulatory Visit: Admitting: Family Medicine

## 2024-03-30 ENCOUNTER — Other Ambulatory Visit: Payer: Self-pay | Admitting: Family Medicine

## 2024-03-30 DIAGNOSIS — E782 Mixed hyperlipidemia: Secondary | ICD-10-CM

## 2024-04-07 ENCOUNTER — Ambulatory Visit: Admitting: Family Medicine

## 2024-04-07 DIAGNOSIS — E782 Mixed hyperlipidemia: Secondary | ICD-10-CM

## 2024-04-07 DIAGNOSIS — N184 Chronic kidney disease, stage 4 (severe): Secondary | ICD-10-CM

## 2024-04-07 DIAGNOSIS — E039 Hypothyroidism, unspecified: Secondary | ICD-10-CM

## 2024-04-07 DIAGNOSIS — D6481 Anemia due to antineoplastic chemotherapy: Secondary | ICD-10-CM

## 2024-04-11 ENCOUNTER — Other Ambulatory Visit: Payer: Self-pay | Admitting: Family Medicine

## 2024-04-11 DIAGNOSIS — I10 Essential (primary) hypertension: Secondary | ICD-10-CM

## 2024-04-11 DIAGNOSIS — E039 Hypothyroidism, unspecified: Secondary | ICD-10-CM

## 2024-04-11 DIAGNOSIS — F3289 Other specified depressive episodes: Secondary | ICD-10-CM

## 2024-04-13 ENCOUNTER — Telehealth: Admitting: Physician Assistant

## 2024-04-13 DIAGNOSIS — R519 Headache, unspecified: Secondary | ICD-10-CM

## 2024-04-13 DIAGNOSIS — I10 Essential (primary) hypertension: Secondary | ICD-10-CM

## 2024-04-13 MED ORDER — TOPIRAMATE 25 MG PO TABS: 25.0000 mg | ORAL_TABLET | Freq: Every day | ORAL | 0 refills | Status: DC

## 2024-04-13 MED ORDER — CLONIDINE HCL 0.2 MG PO TABS: 0.2000 mg | ORAL_TABLET | Freq: Every day | ORAL | 0 refills | Status: DC

## 2024-04-13 NOTE — Patient Instructions (Signed)
 Emily [name], thank you for joining Lovette Borg, PA-C for today's virtual visit.  While this provider is not your primary care provider (PCP), if your PCP is located in our provider database this encounter information will be shared with them immediately following your visit.   A [facility] MyChart account gives you access to today's visit and all your visits, tests, and labs performed at [facility]  click here if you don't have a [facility] MyChart account or go to [email or web address]  Consent: (Patient) Emily [name] provided verbal consent for this virtual visit at the beginning of the encounter.  Current Medications:  Current Outpatient Medications:    topiramate  (TOPAMAX ) 25 MG tablet, Take 1 tablet (25 mg total) by mouth daily. May increase to twice daily after 1 week if headache not controlled., Disp: 30 tablet, Rfl: 0   atorvastatin  (LIPITOR) 40 MG tablet, Take 1 tablet (40 mg total) by mouth daily. Keep appt for refills, Disp: 15 tablet, Rfl: 0   baclofen  (LIORESAL ) 10 MG tablet, Take 1 tablet (10 mg total) by mouth 3 (three) times daily., Disp: 30 each, Rfl: 0   buPROPion  (WELLBUTRIN  SR) 150 MG 12 hr tablet, Take 1 tablet (150 mg total) by mouth 2 (two) times daily., Disp: 60 tablet, Rfl: 3   cephALEXin  (KEFLEX ) 500 MG capsule, Take 1 capsule (500 mg total) by mouth 2 (two) times daily., Disp: 14 capsule, Rfl: 0   cloNIDine  (CATAPRES ) 0.2 MG tablet, Take 1 tablet (0.2 mg total) by mouth daily. KEEP APPT FOR REFILLS, Disp: 30 tablet, Rfl: 0   cyclobenzaprine  (FLEXERIL ) 10 MG tablet, Take 1 tablet (10 mg total) by mouth 3 (three) times daily as needed for muscle spasms., Disp: 15 tablet, Rfl: 0   hydrOXYzine  (VISTARIL ) 50 MG capsule, TAKE 1 CAPSULE BY MOUTH AT BEDTIME, Disp: 30 capsule, Rfl: 0   levothyroxine  (SYNTHROID ) 100 MCG tablet, Take 1 tablet (100 mcg total) by mouth daily before breakfast. KEEP APPT FOR REFILLS, Disp: 30 tablet, Rfl: 0   metoprolol   tartrate (LOPRESSOR ) 50 MG tablet, Take 1 tablet (50 mg total) by mouth in the morning and at bedtime., Disp: 180 tablet, Rfl: 1   naproxen  (NAPROSYN ) 500 MG tablet, Take 1 tablet (500 mg total) by mouth 2 (two) times daily with a meal., Disp: 20 tablet, Rfl: 0   omeprazole  (PRILOSEC) 20 MG capsule, TAKE 1 CAPSULE BY MOUTH EVERY DAY, Disp: 30 capsule, Rfl: 11   ondansetron  (ZOFRAN -ODT) 4 MG disintegrating tablet, Take 1-2 tablets (4-8 mg total) by mouth every 8 (eight) hours as needed., Disp: 20 tablet, Rfl: 0   prazosin  (MINIPRESS ) 5 MG capsule, Take 1 capsule (5 mg total) by mouth at bedtime., Disp: 90 capsule, Rfl: 1   Medications ordered in this encounter:  Meds ordered this encounter  Medications   cloNIDine  (CATAPRES ) 0.2 MG tablet    Sig: Take 1 tablet (0.2 mg total) by mouth daily. KEEP APPT FOR REFILLS    Dispense:  30 tablet    Refill:  0    Supervising Provider:   BLAISE ALEENE KIDD [[id number]]   topiramate  (TOPAMAX ) 25 MG tablet    Sig: Take 1 tablet (25 mg total) by mouth daily. May increase to twice daily after 1 week if headache not controlled.    Dispense:  30 tablet    Refill:  0    Supervising Provider:   BLAISE ALEENE KIDD [phone]     *If you need refills on other medications prior to  your next appointment, please contact your pharmacy*  Follow-Up: Call back or seek an in-person evaluation if the symptoms worsen or if the condition fails to improve as anticipated.  [facility] [phone]  Other Instructions Follow up with PCP as scheduled within 2 weeks. Start headache medicine as prescribed. Increase dose to 25mg  twice daily if needed after 1 week. Continue with Clonidine  as prescribed. Schedule a virtual appointment or follow up at an urgent care clinic if symptoms don't improve.   If you have been instructed to have an in-person evaluation today at a local Urgent Care facility, please use the link below. It will take you to a list of all of our  available [facility], including address, phone number and hours of operation. Please do not delay care.  [facility]  If you or a family member do not have a primary care provider, use the link below to schedule a visit and establish care. When you choose a [facility] primary care physician or advanced practice provider, you gain a long-term partner in health. Find a Primary Care Provider  Learn more about [facility] in-office and virtual care options:  - Get Care Now

## 2024-04-13 NOTE — Progress Notes (Signed)
 Virtual Visit Consent   Emily [name], you are scheduled for a virtual visit with a [facility] provider today. Just as with appointments in the office, your consent must be obtained to participate. Your consent will be active for this visit and any virtual visit you may have with one of our providers in the next 365 days. If you have a MyChart account, a copy of this consent can be sent to you electronically.  As this is a virtual visit, video technology does not allow for your provider to perform a traditional examination. This may limit your provider's ability to fully assess your condition. If your provider identifies any concerns that need to be evaluated in person or the need to arrange testing (such as labs, EKG, etc.), we will make arrangements to do so. Although advances in technology are sophisticated, we cannot ensure that it will always work on either your end or our end. If the connection with a video visit is poor, the visit may have to be switched to a telephone visit. With either a video or telephone visit, we are not always able to ensure that we have a secure connection.  By engaging in this virtual visit, you consent to the provision of healthcare and authorize for your insurance to be billed (if applicable) for the services provided during this visit. Depending on your insurance coverage, you may receive a charge related to this service.  I need to obtain your verbal consent now. Are you willing to proceed with your visit today? Emily [name] has provided verbal consent on 04/13/2024 for a virtual visit (video or telephone). Emily [name], [place]  Date: 04/13/2024 7:02 PM   Virtual Visit via Video Note   I, Emily [name], connected with  Emily [name]  ([id number], 1956/05/11) on 04/13/24 at  7:00 PM EDT by a video-enabled telemedicine application and verified that I am speaking with the correct person using two identifiers.  Location: Patient: Virtual Visit Location Patient:  Home Provider: Virtual Visit Location Provider: Home Office   I discussed the limitations of evaluation and management by telemedicine and the availability of in person appointments. The patient expressed understanding and agreed to proceed.    History of Present Illness: Emily [name] is a 68 y.o. who identifies as a female who was assigned female at birth, and is being seen today for headache.  HPI: 68y/o F presents for a telehealth video visit for c/o headache x 1 week and not improving. Denies head or neck trauma. She used to take Topamax  daily when she was younger, but hasn't had to take it in a while until she lost 2 family members(sister and son) within two weeks of each other. Since then she has noticed headaches returned. She is also working in a stressful job and not sure if this could be related to it. Her BP today was 115/82. Requesting Rx refill for Clonidine  0.2mg . She has an appointment with her PCP in approximately 2 weeks.    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 May increase to twice daily after 1 week if headache not controlled., Disp: 30 tablet, Rfl: 0   atorvastatin  (LIPITOR) 40 MG tablet, Take 1 tablet (40 mg total) by mouth daily. Keep appt for refills, Disp: 15 tablet, Rfl: 0    baclofen  (LIORESAL ) 10 MG tablet, Take 1 tablet (10 mg total) by mouth 3 (three) times daily., Disp: 30 each, Rfl: 0   buPROPion  (WELLBUTRIN  SR) 150 MG 12 hr tablet, Take 1 tablet (150 mg total) by mouth 2 (two) times daily., Disp: 60 tablet, Rfl: 3   cephALEXin  (KEFLEX ) 500 MG capsule, Take 1 capsule (500 mg total) by mouth 2 (two) times daily., Disp: 14 capsule, Rfl: 0   cloNIDine  (CATAPRES ) 0.2 MG tablet, Take 1 tablet (0.2 mg total) by mouth daily. KEEP APPT FOR REFILLS, Disp: 30 tablet, Rfl: 0   cyclobenzaprine  (FLEXERIL ) 10 MG tablet, Take 1 tablet (10 mg total) by mouth 3 (three) times daily as needed for muscle spasms., Disp: 15 tablet, Rfl: 0   hydrOXYzine  (VISTARIL ) 50 MG capsule, TAKE 1 CAPSULE BY MOUTH AT BEDTIME, Disp: 30 capsule, Rfl: 0   levothyroxine  (SYNTHROID ) 100 MCG tablet, Take 1 tablet (100 mcg total) by mouth daily before breakfast. KEEP APPT FOR REFILLS, Disp: 30 tablet, Rfl: 0   metoprolol  tartrate (LOPRESSOR ) 50 MG tablet, Take 1 tablet (50 mg total) by mouth in the morning and at bedtime., Disp: 180 tablet, Rfl: 1   naproxen  (NAPROSYN ) 500 MG tablet, Take 1 tablet (500 mg total) by mouth 2 (two) times daily with a meal., Disp: 20 tablet, Rfl: 0   omeprazole  (PRILOSEC) 20 MG capsule, TAKE 1 CAPSULE BY MOUTH EVERY DAY, Disp: 30 capsule, Rfl: 11   ondansetron  (ZOFRAN -ODT) 4 MG disintegrating tablet, Take 1-2 tablets (4-8 mg total) by mouth every 8 (eight) hours as needed., Disp: 20 tablet, Rfl: 0   prazosin  (MINIPRESS ) 5 MG capsule, Take 1 capsule (5 mg total) by mouth at bedtime., Disp: 90 capsule, Rfl: 1  Observations/Objective: Patient is well-developed, well-nourished in no acute distress.  Resting comfortably  at home.  Head is normocephalic, atraumatic.  No labored breathing.  Speech is clear and coherent with logical content.  Patient is alert and oriented at baseline.    Assessment and Plan: 1. Hypertension, unspecified type (Primary) - cloNIDine  (CATAPRES ) 0.2  MG tablet; Take 1 tablet (0.2 mg total) by mouth daily. KEEP APPT FOR REFILLS  Dispense: 30 tablet; Refill: 0  2. Headache disorder - topiramate  (TOPAMAX ) 25 MG tablet; Take 1 tablet (25 mg total) by mouth daily. May increase to twice daily after 1 week if headache not controlled.  Dispense: 30 tablet; Refill: 0  Follow up with PCP as scheduled within 2 weeks. Start headache medicine as prescribed. Increase dose to 25mg  twice daily if needed after 1 week. Continue with Clonidine  as prescribed. Schedule a virtual appointment or follow up at an urgent care clinic if symptoms don't improve.  Pt verbalized understanding and in agreement.    Follow Up Instructions: I discussed the assessment and treatment plan with the patient. The patient was provided an opportunity to ask questions and all were answered. The patient agreed with the plan and demonstrated an understanding of the instructions.  A copy of instructions were sent to the  patient via MyChart unless otherwise noted below.   Patient has requested to receive PHI (AVS, Work Notes, etc) pertaining to this video visit through e-mail as they are currently without active MyChart. They have voiced understand that email is not considered secure and their health information could be viewed by someone other than the patient.   The patient was advised to call back or seek an in-person evaluation if the symptoms worsen or if the condition fails to improve as anticipated.    Avenell Sellers, PA-C

## 2024-04-22 ENCOUNTER — Ambulatory Visit: Admitting: Family Medicine

## 2024-04-26 ENCOUNTER — Ambulatory Visit: Admitting: Family Medicine

## 2024-04-28 ENCOUNTER — Ambulatory Visit (INDEPENDENT_AMBULATORY_CARE_PROVIDER_SITE_OTHER): Admitting: Family Medicine

## 2024-04-28 ENCOUNTER — Encounter: Payer: Self-pay | Admitting: Family Medicine

## 2024-04-28 VITALS — BP 139/80 | HR 87 | Ht 65.0 in | Wt 227.0 lb

## 2024-04-28 DIAGNOSIS — F3289 Other specified depressive episodes: Secondary | ICD-10-CM

## 2024-04-28 DIAGNOSIS — E039 Hypothyroidism, unspecified: Secondary | ICD-10-CM

## 2024-04-28 DIAGNOSIS — E782 Mixed hyperlipidemia: Secondary | ICD-10-CM

## 2024-04-28 DIAGNOSIS — I10 Essential (primary) hypertension: Secondary | ICD-10-CM

## 2024-04-28 DIAGNOSIS — Z23 Encounter for immunization: Secondary | ICD-10-CM | POA: Diagnosis not present

## 2024-04-28 DIAGNOSIS — G4700 Insomnia, unspecified: Secondary | ICD-10-CM | POA: Insufficient documentation

## 2024-04-28 DIAGNOSIS — E611 Iron deficiency: Secondary | ICD-10-CM | POA: Insufficient documentation

## 2024-04-28 DIAGNOSIS — E538 Deficiency of other specified B group vitamins: Secondary | ICD-10-CM | POA: Insufficient documentation

## 2024-04-28 DIAGNOSIS — Z780 Asymptomatic menopausal state: Secondary | ICD-10-CM

## 2024-04-28 MED ORDER — HYDROXYZINE PAMOATE 50 MG PO CAPS: 50.0000 mg | ORAL_CAPSULE | Freq: Every day | ORAL | 3 refills | Status: DC

## 2024-04-28 MED ORDER — ATORVASTATIN CALCIUM 40 MG PO TABS: 40.0000 mg | ORAL_TABLET | Freq: Every day | ORAL | 1 refills | Status: AC

## 2024-04-28 MED ORDER — LEVOTHYROXINE SODIUM 100 MCG PO TABS: 100.0000 ug | ORAL_TABLET | Freq: Every day | ORAL | 1 refills | Status: AC

## 2024-04-28 MED ORDER — TRAZODONE HCL 50 MG PO TABS: 25.0000 mg | ORAL_TABLET | Freq: Every day | ORAL | 1 refills | Status: DC

## 2024-04-28 MED ORDER — METOPROLOL TARTRATE 50 MG PO TABS: 50.0000 mg | ORAL_TABLET | Freq: Two times a day (BID) | ORAL | 1 refills | Status: AC

## 2024-04-28 MED ORDER — CLONIDINE HCL 0.2 MG PO TABS: 0.2000 mg | ORAL_TABLET | Freq: Every day | ORAL | 1 refills | Status: AC

## 2024-04-28 MED ORDER — BUPROPION HCL ER (SR) 200 MG PO TB12
200.0000 mg | ORAL_TABLET | Freq: Two times a day (BID) | ORAL | 1 refills | Status: AC
Start: 2024-04-28 — End: ?

## 2024-04-28 MED ORDER — PRAZOSIN HCL 5 MG PO CAPS: 5.0000 mg | ORAL_CAPSULE | Freq: Every day | ORAL | 1 refills | Status: AC

## 2024-04-28 NOTE — Assessment & Plan Note (Signed)
-  Reviewed most recent lipid panel -Medication management: continue lipitor -Repeat CMP and lipid panel ordered -Diet low in saturated fat -Regular exercise - at least 30 minutes, 5 times per week

## 2024-04-28 NOTE — Assessment & Plan Note (Signed)
Blood pressure is at goal for age and co-morbidities (based on home readings).  I recommend continuing current regimen.   - BP goal <130/80 - monitor and log blood pressures at home - check around the same time each day in a relaxed setting - Limit salt to <2000 mg/day - Follow DASH eating plan (heart healthy diet) - limit alcohol to 2 standard drinks per day for men and 1 per day for women - avoid tobacco products - get at least 2 hours of regular aerobic exercise weekly Patient aware of signs/symptoms requiring further/urgent evaluation. Labs ordered.

## 2024-04-28 NOTE — Assessment & Plan Note (Signed)
 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 On Wellbutrin  150 mg twice daily. Reports irritability and sleep issues. Open to medication adjustment and counseling. - Increase Wellbutrin  to 200 mg twice daily. - Prescribe trazodone , starting with half a tablet at bedtime as needed for sleep, with the option to increase to a full tablet if necessary. - Encourage counseling and follow up with the new online therapist appointment next week.

## 2024-04-28 NOTE — Assessment & Plan Note (Signed)
 Labs today

## 2024-04-28 NOTE — Assessment & Plan Note (Signed)
See Depression

## 2024-04-28 NOTE — Assessment & Plan Note (Signed)
 Labs today Healthy lifestyle encouraged - low carb, heart healthy diet, portion control, daily exercise

## 2024-04-28 NOTE — Progress Notes (Signed)
 Established Patient Office Visit  Subjective   Patient ID: Emily [name], female    DOB: [birth year]  Age: 68 y.o. MRN: [id number]  Chief Complaint  Patient presents with   Medical Management of Chronic Issues    HPI    Discussed the use of AI scribe software for clinical note transcription with the patient, who gave verbal consent to proceed.  History of Present Illness Emily [name] is a 68 year old female who presents for a regular checkup and medication refills.  She has a long history of migraines since her teenage years and was recently restarted on Topamax  (topiramate ) following a telehealth visit. She is currently taking 50 mg of Topamax  (topiramate ) as part of a dose escalation schedule.   She experiences insomnia and is interested in addressing her sleep issues. She has tried melatonin without success and reports that hydroxyzine  at bedtime is not effective. She also recounts an experience with CBD, which she found overwhelming, describing it as feeling 'like I was in a cave for two days'.  She has a history of depression and anxiety, previously managed with Wellbutrin  (bupropion ) 150 mg twice daily. She feels irritable and angry, which she attributes to the grief process following her sister's death from a hemorrhagic stroke.  She has not been taking her routine meds for a couple of weeks and acknowledges that her blood pressure has been elevated. She does not monitor her blood pressure at home and reports no chest pain or headaches aside from her migraines.  She works from home and describes herself as sedentary, relying on delivery services for groceries and other needs. She has a history of anxiety and depression, previously managed with counseling, but has not continued due to insurance changes. She has an appointment with a new therapist  next week.             04/28/2024    1:06 PM  PHQ9 SCORE ONLY  PHQ-9 Total Score 14      04/28/2024    1:07 PM   GAD 7 : Generalized Anxiety Score  Nervous, Anxious, on Edge 1  Control/stop worrying 2  Worry too much - different things 2  Trouble relaxing 3  Restless 1  Easily annoyed or irritable 1  Afraid - awful might happen 0  Total GAD 7 Score 10  Anxiety Difficulty Somewhat difficult        ROS All review of systems negative except what is listed in the HPI    Objective:     BP 139/80   Pulse 87   Ht 5' 5 (1.651 m)   Wt 227 lb (103 kg)   SpO2 96%   BMI 37.77 kg/m    Physical Exam Vitals reviewed.  Constitutional:      Appearance: Normal appearance. She is obese.  Cardiovascular:     Rate and Rhythm: Normal rate and regular rhythm.  Pulmonary:     Effort: Pulmonary effort is normal.     Breath sounds: Normal breath sounds.  Musculoskeletal:     Right lower leg: No edema.     Left lower leg: No edema.  Neurological:     General: No focal deficit present.     Mental Status: She is alert and oriented to person, place, and time. Mental status is at baseline.  Psychiatric:        Mood and Affect: Mood normal.        Behavior: Behavior normal.  Thought Content: Thought content normal.        Judgment: Judgment normal.     Comments: Tearful at times         No results found for any visits on 04/28/24.    The 10-year ASCVD risk score (Arnett DK, et al., 2019) is: 11.3%    Assessment & Plan:   Problem List Items Addressed This Visit       Active Problems   HLD (hyperlipidemia)   -Reviewed most recent lipid panel -Medication management: continue lipitor -Repeat CMP and lipid panel ordered -Diet low in saturated fat -Regular exercise - at least 30 minutes, 5 times per week       Relevant Medications   atorvastatin  (LIPITOR) 40 MG tablet   cloNIDine  (CATAPRES ) 0.2 MG tablet   metoprolol  tartrate (LOPRESSOR ) 50 MG tablet   prazosin  (MINIPRESS ) 5 MG capsule   Other Relevant Orders   Lipid panel   HTN (hypertension) - Primary   Blood  pressure is at goal for age and co-morbidities (based on home readings).  I recommend continuing current regimen.   - BP goal <130/80 - monitor and log blood pressures at home - check around the same time each day in a relaxed setting - Limit salt to <2000 mg/day - Follow DASH eating plan (heart healthy diet) - limit alcohol to 2 standard drinks per day for men and 1 per day for women - avoid tobacco products - get at least 2 hours of regular aerobic exercise weekly Patient aware of signs/symptoms requiring further/urgent evaluation. Labs ordered.       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 On Wellbutrin  150 mg twice daily. Reports irritability and sleep issues. Open to medication adjustment and counseling. - Increase Wellbutrin  to 200 mg twice daily. - Prescribe trazodone , starting with half a tablet at bedtime as needed for sleep, with the option to increase to a full tablet if necessary. - Encourage counseling and follow up with the new online therapist appointment next week.      Relevant Medications   traZODone  (DESYREL ) 50 MG tablet   buPROPion  (WELLBUTRIN  SR) 200 MG 12 hr tablet   hydrOXYzine  (VISTARIL ) 50 MG capsule   Hypothyroidism   Hypothyroidism with recent lapse in Synthroid . No recent TSH check. - Restart Synthroid   - Labs today      Relevant Medications   levothyroxine  (SYNTHROID ) 100 MCG tablet   metoprolol  tartrate (LOPRESSOR ) 50 MG tablet   Other Relevant Orders   TSH   Insomnia   See 'Depression'      Relevant Medications   traZODone  (DESYREL ) 50 MG tablet   B12 deficiency   Labs today      Relevant Orders   B12 and Folate Panel   Iron deficiency   Labs today       Relevant Orders   CBC with Differential/Platelet   IBC + Ferritin   Morbid (severe) obesity due to excess calories (HCC)   Labs today Healthy lifestyle encouraged - low carb, heart healthy diet, portion control, daily exercise      Other Visit Diagnoses       Postmenopausal       Relevant Orders   HM DEXA  SCAN (Completed)     Flu vaccine need       Relevant Orders   Flu vaccine HIGH DOSE PF(Fluzone Trivalent) (Completed)          Return in about 6 months (around 10/26/2024) for chronic disease management.    Waddell KATHEE Mon, NP

## 2024-04-28 NOTE — Assessment & Plan Note (Signed)
 Hypothyroidism with recent lapse in Synthroid . No recent TSH check. - Restart Synthroid   - Labs today

## 2024-04-29 ENCOUNTER — Encounter: Payer: Self-pay | Admitting: Family Medicine

## 2024-04-29 DIAGNOSIS — R519 Headache, unspecified: Secondary | ICD-10-CM

## 2024-04-29 LAB — COMPREHENSIVE METABOLIC PANEL WITH GFR
ALT: 14 U/L (ref 0–35)
AST: 12 U/L (ref 0–37)
Albumin: 4.3 g/dL (ref 3.5–5.2)
Alkaline Phosphatase: 105 U/L (ref 39–117)
BUN: 13 mg/dL (ref 6–23)
CO2: 24 meq/L (ref 19–32)
Calcium: 8.2 mg/dL — ABNORMAL LOW (ref 8.4–10.5)
Chloride: 103 meq/L (ref 96–112)
Creatinine, Ser: 1.2 mg/dL (ref 0.40–1.20)
GFR: 46.72 mL/min — ABNORMAL LOW (ref >60.00)
Glucose, Bld: 111 mg/dL — ABNORMAL HIGH (ref 70–99)
Potassium: 3.3 meq/L — ABNORMAL LOW (ref 3.5–5.1)
Sodium: 139 meq/L (ref 135–145)
Total Bilirubin: 0.5 mg/dL (ref 0.2–1.2)
Total Protein: 7 g/dL (ref 6.0–8.3)

## 2024-04-29 LAB — CBC WITH DIFFERENTIAL/PLATELET
Basophils Absolute: 0.1 K/uL (ref 0.0–0.1)
Basophils Relative: 0.7 % (ref 0.0–3.0)
Eosinophils Absolute: 0.1 K/uL (ref 0.0–0.7)
Eosinophils Relative: 0.9 % (ref 0.0–5.0)
HCT: 39.1 % (ref 36.0–46.0)
Hemoglobin: 13.2 g/dL (ref 12.0–15.0)
Lymphocytes Relative: 14.9 % (ref 12.0–46.0)
Lymphs Abs: 1.5 K/uL (ref 0.7–4.0)
MCHC: 33.8 g/dL (ref 30.0–36.0)
MCV: 82.7 fl (ref 78.0–100.0)
Monocytes Absolute: 0.5 K/uL (ref 0.1–1.0)
Monocytes Relative: 5.5 % (ref 3.0–12.0)
Neutro Abs: 7.7 K/uL (ref 1.4–7.7)
Neutrophils Relative %: 78 % — ABNORMAL HIGH (ref 43.0–77.0)
Platelets: 182 K/uL (ref 150.0–400.0)
RBC: 4.73 Mil/uL (ref 3.87–5.11)
RDW: 15.6 % — ABNORMAL HIGH (ref 11.5–15.5)
WBC: 9.8 K/uL (ref 4.0–10.5)

## 2024-04-29 LAB — TSH: TSH: 6.18 u[IU]/mL — ABNORMAL HIGH (ref 0.35–5.50)

## 2024-04-29 LAB — B12 AND FOLATE PANEL
Folate: 4.9 ng/mL — ABNORMAL LOW (ref >5.9)
Vitamin B-12: 162 pg/mL — ABNORMAL LOW (ref 211–911)

## 2024-04-29 LAB — LIPID PANEL
Cholesterol: 116 mg/dL (ref 0–200)
HDL: 31.9 mg/dL — ABNORMAL LOW (ref >39.00)
LDL Cholesterol: 23 mg/dL (ref 0–99)
NonHDL: 84.05
Total CHOL/HDL Ratio: 4
Triglycerides: 306 mg/dL — ABNORMAL HIGH (ref 0.0–149.0)
VLDL: 61.2 mg/dL — ABNORMAL HIGH (ref 0.0–40.0)

## 2024-04-29 LAB — IBC + FERRITIN
Ferritin: 25.9 ng/mL (ref 10.0–291.0)
Iron: 46 ug/dL (ref 42–145)
Saturation Ratios: 14.2 % — ABNORMAL LOW (ref 20.0–50.0)
TIBC: 324.8 ug/dL (ref 250.0–450.0)
Transferrin: 232 mg/dL (ref 212.0–360.0)

## 2024-04-29 MED ORDER — TOPIRAMATE 25 MG PO TABS
25.0000 mg | ORAL_TABLET | Freq: Every day | ORAL | 3 refills | Status: DC
Start: 2024-04-29 — End: 2024-05-31

## 2024-05-03 ENCOUNTER — Other Ambulatory Visit

## 2024-05-03 ENCOUNTER — Ambulatory Visit: Payer: Self-pay | Admitting: Family Medicine

## 2024-05-03 DIAGNOSIS — E559 Vitamin D deficiency, unspecified: Secondary | ICD-10-CM

## 2024-05-03 DIAGNOSIS — E039 Hypothyroidism, unspecified: Secondary | ICD-10-CM

## 2024-05-03 DIAGNOSIS — E538 Deficiency of other specified B group vitamins: Secondary | ICD-10-CM

## 2024-05-04 ENCOUNTER — Ambulatory Visit: Payer: Self-pay | Admitting: Neurology

## 2024-05-04 DIAGNOSIS — E538 Deficiency of other specified B group vitamins: Secondary | ICD-10-CM

## 2024-05-06 LAB — TIQ- AMBIGUOUS ORDER

## 2024-05-08 LAB — TEST AUTHORIZATION

## 2024-05-08 LAB — METHYLMALONIC ACID(MMA), RND URINE

## 2024-05-08 LAB — TSH: TSH: 6.04 m[IU]/L — ABNORMAL HIGH (ref 0.40–4.50)

## 2024-05-08 LAB — HOMOCYSTEINE: Homocysteine: 30.9 umol/L — ABNORMAL HIGH (ref <=13.4)

## 2024-05-08 LAB — INTRINSIC FACTOR BLOCKING ANTIBODY: Intrinsic Factor: NEGATIVE

## 2024-05-14 ENCOUNTER — Telehealth: Admitting: Family Medicine

## 2024-05-14 ENCOUNTER — Other Ambulatory Visit (INDEPENDENT_AMBULATORY_CARE_PROVIDER_SITE_OTHER)

## 2024-05-14 DIAGNOSIS — E538 Deficiency of other specified B group vitamins: Secondary | ICD-10-CM | POA: Diagnosis not present

## 2024-05-14 DIAGNOSIS — R3989 Other symptoms and signs involving the genitourinary system: Secondary | ICD-10-CM

## 2024-05-14 MED ORDER — CEPHALEXIN 500 MG PO CAPS: 500.0000 mg | ORAL_CAPSULE | Freq: Two times a day (BID) | ORAL | 0 refills | Status: AC

## 2024-05-14 NOTE — Progress Notes (Signed)

## 2024-05-18 ENCOUNTER — Ambulatory Visit: Payer: Self-pay | Admitting: Family Medicine

## 2024-05-18 LAB — METHYLMALONIC ACID, SERUM: Methylmalonic Acid, Quant: 124 nmol/L (ref 69–390)

## 2024-05-30 ENCOUNTER — Other Ambulatory Visit: Payer: Self-pay | Admitting: Family Medicine

## 2024-05-30 DIAGNOSIS — R519 Headache, unspecified: Secondary | ICD-10-CM

## 2024-06-03 ENCOUNTER — Other Ambulatory Visit: Payer: Self-pay | Admitting: Medical Genetics

## 2024-06-03 DIAGNOSIS — Z006 Encounter for examination for normal comparison and control in clinical research program: Secondary | ICD-10-CM

## 2024-06-06 ENCOUNTER — Other Ambulatory Visit: Payer: Self-pay | Admitting: Family Medicine

## 2024-06-06 DIAGNOSIS — G4700 Insomnia, unspecified: Secondary | ICD-10-CM

## 2024-06-07 NOTE — Telephone Encounter (Signed)
 Requested Prescriptions   Pending Prescriptions Disp Refills   traZODone  (DESYREL ) 50 MG tablet [Pharmacy Med Name: TRAZODONE  50MG  TABLETS] 30 tablet 1    Sig: TAKE 1/2 TO 1 TABLET(25 TO 50 MG) BY MOUTH AT BEDTIME     Date of patient request: 06/07/2024 Last office visit: 04/28/2024 Upcoming visit: Visit date not found Date of last refill: 04/28/2024 Last refill amount: 30 1 refill

## 2024-06-25 ENCOUNTER — Telehealth: Payer: Self-pay | Admitting: Family Medicine

## 2024-06-25 NOTE — Telephone Encounter (Signed)
 Copied from CRM [id number]. Topic: Medicare AWV >> Jun 25, 2024  1:28 PM Nathanel DEL wrote: Called LVM 06/25/2024 to sched AWV. Please schedule in office or virtual visit.   Nathanel Paschal; Care Guide Ambulatory Clinical Support [place] [facility] Direct Dial: [phone]

## 2024-07-09 ENCOUNTER — Other Ambulatory Visit: Payer: Self-pay | Admitting: Family Medicine

## 2024-07-09 DIAGNOSIS — R519 Headache, unspecified: Secondary | ICD-10-CM

## 2024-07-09 NOTE — Telephone Encounter (Signed)
 LVM for patient to call back to verify how they are taking medication. Instructions to increase, if they are on higher dosage will change to higher mg. Awaiting call back with clarification.

## 2024-08-20 ENCOUNTER — Other Ambulatory Visit: Payer: Self-pay | Admitting: Family Medicine

## 2024-08-20 DIAGNOSIS — F3289 Other specified depressive episodes: Secondary | ICD-10-CM
